# Patient Record
Sex: Female | Born: 1995 | Hispanic: Yes | Marital: Single | Smoking: Never smoker
Health system: Southern US, Community
[De-identification: ages and names within clinical notes are randomized; demographics above are authoritative.]

## PROBLEM LIST (undated history)

## (undated) ENCOUNTER — Inpatient Hospital Stay (HOSPITAL_COMMUNITY): Payer: Self-pay

## (undated) DIAGNOSIS — O34599 Maternal care for other abnormalities of gravid uterus, unspecified trimester: Secondary | ICD-10-CM

## (undated) DIAGNOSIS — Q5128 Other doubling of uterus, other specified: Secondary | ICD-10-CM

## (undated) DIAGNOSIS — B951 Streptococcus, group B, as the cause of diseases classified elsewhere: Secondary | ICD-10-CM

## (undated) DIAGNOSIS — Q600 Renal agenesis, unilateral: Secondary | ICD-10-CM

## (undated) HISTORY — DX: Renal agenesis, unilateral: Q60.0

## (undated) HISTORY — DX: Streptococcus, group b, as the cause of diseases classified elsewhere: B95.1

## (undated) HISTORY — PX: NO PAST SURGERIES: SHX2092

---

## 2013-07-27 NOTE — L&D Delivery Note (Addendum)
Delivery Note  5228w3d IOL 2/2 anhydramnios suspected to be due to preterm premature rupture of membranes although patient denied loss of fluid with chorio. At 4:16 PM a viable female was delivered via Vaginal, Spontaneous Delivery (Presentation: LOA ).  APGAR: 8, 9; weight 4 lb 9.7 oz (2090 g).   Placenta status: Intact, Spontaneous Pathology.  Cord: 3 vessels with the following complications: manual extraction secondary to vilamentous cord insertion with suspected cord avulsion although cord remained connected to membranes, cervical laceration.  Unclear if patient had vaginal septum vs cervical septum, last exam by me patient was dilated to 2/100/0, suspect patient pushed through cervix vs rapid cervical change.  Anesthesia: Epidural  Episiotomy: None Lacerations: Cervical Suture Repair: no repair at this time, vagina was packed.  Packing left in vagina Est. Blood Loss (mL): 400  Mom to postpartum.  Baby to Couplet care / Skin to Skin.  Carol [name] ROCIO 05/22/2014, 8:31 PM

## 2013-11-27 LAB — OB RESULTS CONSOLE HGB/HCT, BLOOD
HEMATOCRIT: 35 %
Hemoglobin: 11.7 g/dL

## 2013-11-27 LAB — OB RESULTS CONSOLE ABO/RH: RH Type: POSITIVE

## 2013-11-27 LAB — OB RESULTS CONSOLE RUBELLA ANTIBODY, IGM: RUBELLA: IMMUNE

## 2013-11-27 LAB — OB RESULTS CONSOLE HEPATITIS B SURFACE ANTIGEN: Hepatitis B Surface Ag: NEGATIVE

## 2013-11-27 LAB — SICKLE CELL SCREEN: Sickle Cell Screen: NORMAL

## 2013-11-27 LAB — OB RESULTS CONSOLE HIV ANTIBODY (ROUTINE TESTING): HIV: NONREACTIVE

## 2013-11-27 LAB — OB RESULTS CONSOLE RPR: RPR: NONREACTIVE

## 2013-11-27 LAB — CULTURE, OB URINE: URINE CULTURE, OB: NEGATIVE

## 2013-11-27 LAB — OB RESULTS CONSOLE GC/CHLAMYDIA
CHLAMYDIA, DNA PROBE: NEGATIVE
GC PROBE AMP, GENITAL: NEGATIVE

## 2013-11-27 LAB — OB RESULTS CONSOLE VARICELLA ZOSTER ANTIBODY, IGG: VARICELLA IGG: IMMUNE

## 2013-11-27 LAB — GLUCOSE TOLERANCE, 1 HOUR (50G) W/O FASTING: GLUCOSE 1 HOUR GTT: 108 mg/dL (ref ?–200)

## 2013-11-27 LAB — OB RESULTS CONSOLE ANTIBODY SCREEN: Antibody Screen: NEGATIVE

## 2014-02-28 DIAGNOSIS — Z789 Other specified health status: Secondary | ICD-10-CM

## 2014-02-28 DIAGNOSIS — Q5128 Other doubling of uterus, other specified: Principal | ICD-10-CM

## 2014-02-28 DIAGNOSIS — O34592 Maternal care for other abnormalities of gravid uterus, second trimester: Secondary | ICD-10-CM

## 2014-02-28 DIAGNOSIS — Z758 Other problems related to medical facilities and other health care: Secondary | ICD-10-CM

## 2014-03-02 ENCOUNTER — Encounter: Payer: Self-pay | Admitting: *Deleted

## 2014-03-02 DIAGNOSIS — Q5128 Other doubling of uterus, other specified: Secondary | ICD-10-CM | POA: Insufficient documentation

## 2014-03-02 DIAGNOSIS — Z789 Other specified health status: Secondary | ICD-10-CM | POA: Insufficient documentation

## 2014-03-02 DIAGNOSIS — Q512 Other doubling of uterus, unspecified: Secondary | ICD-10-CM

## 2014-03-22 ENCOUNTER — Encounter: Payer: Self-pay | Admitting: Family

## 2014-03-22 ENCOUNTER — Ambulatory Visit (INDEPENDENT_AMBULATORY_CARE_PROVIDER_SITE_OTHER): Payer: Medicaid Other | Admitting: Obstetrics & Gynecology

## 2014-03-22 VITALS — BP 118/68 | HR 85 | Temp 99.1°F | Ht 62.5 in | Wt 148.4 lb

## 2014-03-22 DIAGNOSIS — O0992 Supervision of high risk pregnancy, unspecified, second trimester: Secondary | ICD-10-CM

## 2014-03-22 DIAGNOSIS — O0990 Supervision of high risk pregnancy, unspecified, unspecified trimester: Secondary | ICD-10-CM | POA: Insufficient documentation

## 2014-03-22 DIAGNOSIS — O3400 Maternal care for unspecified congenital malformation of uterus, unspecified trimester: Secondary | ICD-10-CM

## 2014-03-22 LAB — POCT URINALYSIS DIP (DEVICE)
Bilirubin Urine: NEGATIVE
Glucose, UA: NEGATIVE mg/dL
Hgb urine dipstick: NEGATIVE
Ketones, ur: NEGATIVE mg/dL
LEUKOCYTES UA: NEGATIVE
Nitrite: NEGATIVE
PH: 7.5 (ref 5.0–8.0)
PROTEIN: NEGATIVE mg/dL
Specific Gravity, Urine: 1.015 (ref 1.005–1.030)
Urobilinogen, UA: 0.2 mg/dL (ref 0.0–1.0)

## 2014-03-22 NOTE — Progress Notes (Signed)
New OB referred by [facility] with uterine didelphis by [place] 7/24 at [facility]  Subjective:referred due to uterine didelphis    Carol [name] is a G1P0 [redacted]w[redacted]d being seen today for her first obstetrical visit.  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 Patient does intend to breast feed. Pregnancy history fully reviewed.  Patient reports no complaints.  Filed Vitals:   03/22/14 0927 03/22/14 0934  BP: 118/68   Pulse: 85   Temp: 99.1 F (37.3 C)   Height:  5' 2.5" (1.588 m)  Weight: 148 lb 6.4 oz (67.314 kg)     HISTORY: OB History  Gravida Para Term Preterm AB SAB TAB Ectopic Multiple Living  1             # Outcome Date GA Lbr Len/2nd Weight Sex Delivery Anes PTL Lv  1 CUR              History reviewed. No pertinent past medical history. History reviewed. No pertinent past surgical history. Family History  Problem Relation Age of Onset  . Diabetes Mother   . Diabetes Father      Exam    Uterus:  Fundal Height: 25 cm  Pelvic Exam:    Perineum:    Vulva:    Vagina:     pH:    Cervix:    Adnexa:    Bony Pelvis: average  System: Breast:      Skin: normal coloration and turgor, no rashes    Neurologic: oriented, normal mood   Extremities: normal strength, tone, and muscle mass   HEENT PERRLA   Mouth/Teeth dental hygiene good   Neck supple   Cardiovascular:     Respiratory:  appears well, vitals normal, no respiratory distress, acyanotic, normal RR   Abdomen: soft, non-tender; bowel sounds normal; no masses,  no organomegaly   Urinary: urethral meatus normal      Assessment:    Pregnancy: G1P0 Patient Active Problem List   Diagnosis Date Noted  . Supervision of high risk pregnancy, antepartum 03/22/2014  . 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 Language barrier 03/02/2014        Plan:     Initial labs drawn. Prenatal vitamins. Problem list reviewed and updated. Genetic Screening discussed was not done Follow up  in 2 weeks. 50% of 30 min visit spent on counseling and coordination of care.  [place] repeat   ARNOLD,JAMES 03/23/2014

## 2014-03-22 NOTE — Progress Notes (Signed)
C/o abdomen feeling tight sometimes, usually after dinner or walking a lot. States spotted brownish discharge at 3 months pregnant, none since. Given new patient information. Discussed BMI/ Appropriate weight gain.

## 2014-03-22 NOTE — Patient Instructions (Signed)

## 2014-03-22 NOTE — Progress Notes (Signed)
Ultrasound with MFM 03/30/14 @ 1030a, appointment information given to pt through interpreter Dori.

## 2014-03-23 LAB — WET PREP, GENITAL
Clue Cells Wet Prep HPF POC: NONE SEEN
Trich, Wet Prep: NONE SEEN
WBC, Wet Prep HPF POC: NONE SEEN
YEAST WET PREP: NONE SEEN

## 2014-03-30 ENCOUNTER — Encounter (HOSPITAL_COMMUNITY): Payer: Self-pay

## 2014-03-30 ENCOUNTER — Ambulatory Visit (HOSPITAL_COMMUNITY)
Admission: RE | Admit: 2014-03-30 | Discharge: 2014-03-30 | Disposition: A | Discharge status: Home / Self Care | Payer: Medicaid Other | Source: Ambulatory Visit | Attending: Obstetrics & Gynecology | Admitting: Obstetrics & Gynecology

## 2014-03-30 ENCOUNTER — Other Ambulatory Visit: Payer: Self-pay | Admitting: Obstetrics & Gynecology

## 2014-03-30 ENCOUNTER — Other Ambulatory Visit (HOSPITAL_COMMUNITY): Payer: Self-pay | Admitting: Maternal and Fetal Medicine

## 2014-03-30 VITALS — BP 116/66 | HR 92 | Wt 153.0 lb

## 2014-03-30 DIAGNOSIS — O0992 Supervision of high risk pregnancy, unspecified, second trimester: Secondary | ICD-10-CM

## 2014-03-30 DIAGNOSIS — Q5128 Other doubling of uterus, other specified: Secondary | ICD-10-CM

## 2014-03-30 DIAGNOSIS — O3400 Maternal care for unspecified congenital malformation of uterus, unspecified trimester: Secondary | ICD-10-CM | POA: Diagnosis not present

## 2014-03-30 DIAGNOSIS — O34599 Maternal care for other abnormalities of gravid uterus, unspecified trimester: Principal | ICD-10-CM

## 2014-03-30 DIAGNOSIS — O09899 Supervision of other high risk pregnancies, unspecified trimester: Secondary | ICD-10-CM | POA: Insufficient documentation

## 2014-03-30 DIAGNOSIS — O34592 Maternal care for other abnormalities of gravid uterus, second trimester: Secondary | ICD-10-CM

## 2014-04-05 ENCOUNTER — Ambulatory Visit (INDEPENDENT_AMBULATORY_CARE_PROVIDER_SITE_OTHER): Payer: Medicaid Other | Admitting: Family

## 2014-04-05 VITALS — BP 113/67 | HR 89 | Temp 99.1°F | Wt 150.3 lb

## 2014-04-05 DIAGNOSIS — O0992 Supervision of high risk pregnancy, unspecified, second trimester: Secondary | ICD-10-CM

## 2014-04-05 DIAGNOSIS — Z23 Encounter for immunization: Secondary | ICD-10-CM

## 2014-04-05 DIAGNOSIS — O3400 Maternal care for unspecified congenital malformation of uterus, unspecified trimester: Secondary | ICD-10-CM

## 2014-04-05 DIAGNOSIS — O0990 Supervision of high risk pregnancy, unspecified, unspecified trimester: Secondary | ICD-10-CM

## 2014-04-05 LAB — CBC
HCT: 33.8 % — ABNORMAL LOW (ref 36.0–46.0)
Hemoglobin: 11.6 g/dL — ABNORMAL LOW (ref 12.0–15.0)
MCH: 30.9 pg (ref 26.0–34.0)
MCHC: 34.3 g/dL (ref 30.0–36.0)
MCV: 90.1 fL (ref 78.0–100.0)
Platelets: 248 10*3/uL (ref 150–400)
RBC: 3.75 MIL/uL — ABNORMAL LOW (ref 3.87–5.11)
RDW: 12.9 % (ref 11.5–15.5)
WBC: 8 10*3/uL (ref 4.0–10.5)

## 2014-04-05 LAB — POCT URINALYSIS DIP (DEVICE)
Bilirubin Urine: NEGATIVE
Glucose, UA: NEGATIVE mg/dL
Hgb urine dipstick: NEGATIVE
KETONES UR: NEGATIVE mg/dL
LEUKOCYTES UA: NEGATIVE
Nitrite: NEGATIVE
Protein, ur: NEGATIVE mg/dL
Specific Gravity, Urine: 1.015 (ref 1.005–1.030)
Urobilinogen, UA: 0.2 mg/dL (ref 0.0–1.0)
pH: 8.5 — ABNORMAL HIGH (ref 5.0–8.0)

## 2014-04-05 MED ORDER — TETANUS-DIPHTH-ACELL PERTUSSIS 5-2.5-18.5 LF-MCG/0.5 IM SUSP: 0.5000 mL | Freq: Once | INTRAMUSCULAR | Status: DC

## 2014-04-05 NOTE — Progress Notes (Signed)
No questions or concerns.  Third trimester labs today.  Plans to do flu and Tdap in two weeks. Next growth [place] on 05/11/14.

## 2014-04-05 NOTE — Progress Notes (Signed)
C/o tightness when walks a lot- discussed this could be contractions and advised make sure drinking plenty of water/ fluids and preterm labor precautions.

## 2014-04-06 ENCOUNTER — Encounter: Payer: Self-pay | Admitting: Family

## 2014-04-06 LAB — GLUCOSE TOLERANCE, 1 HOUR (50G) W/O FASTING: Glucose, 1 Hour GTT: 89 mg/dL (ref 70–140)

## 2014-04-06 LAB — RPR

## 2014-04-06 LAB — HIV ANTIBODY (ROUTINE TESTING W REFLEX): HIV: NONREACTIVE

## 2014-04-19 ENCOUNTER — Ambulatory Visit (INDEPENDENT_AMBULATORY_CARE_PROVIDER_SITE_OTHER): Payer: Medicaid Other | Admitting: Family

## 2014-04-19 VITALS — BP 121/63 | HR 102 | Temp 98.7°F | Wt 154.2 lb

## 2014-04-19 DIAGNOSIS — O0990 Supervision of high risk pregnancy, unspecified, unspecified trimester: Secondary | ICD-10-CM

## 2014-04-19 DIAGNOSIS — Z23 Encounter for immunization: Secondary | ICD-10-CM

## 2014-04-19 DIAGNOSIS — O0992 Supervision of high risk pregnancy, unspecified, second trimester: Secondary | ICD-10-CM

## 2014-04-19 DIAGNOSIS — N898 Other specified noninflammatory disorders of vagina: Secondary | ICD-10-CM

## 2014-04-19 LAB — POCT URINALYSIS DIP (DEVICE)
Bilirubin Urine: NEGATIVE
Glucose, UA: NEGATIVE mg/dL
Hgb urine dipstick: NEGATIVE
Ketones, ur: NEGATIVE mg/dL
Leukocytes, UA: NEGATIVE
Nitrite: NEGATIVE
Protein, ur: NEGATIVE mg/dL
Specific Gravity, Urine: 1.01 (ref 1.005–1.030)
Urobilinogen, UA: 0.2 mg/dL (ref 0.0–1.0)
pH: 7.5 (ref 5.0–8.0)

## 2014-04-19 NOTE — Progress Notes (Signed)
Reports intermittent pelvic pain, especially when lying down and occasional abdominal tightening. C/o of right sided mid-lower back pain.  Flu and tdap today.

## 2014-04-19 NOTE — Progress Notes (Signed)
Reports intermittent pelvic pain, especially when lying down and occasional abdominal tightening (before bedtime). Reports thin white discharge, pelvic: neg pooling, neg fern; appears like BV on exam.  Wet prep sent.   C/o of right sided mid-lower back pain.  Denies UTI symptoms.  Flu and Tdap today.  Growth [place] scheduled on 05/11/14.

## 2014-04-19 NOTE — Addendum Note (Signed)
Addended by: Louanna Raw on: 04/19/2014 12:42 PM   Modules accepted: Orders

## 2014-04-20 LAB — WET PREP, GENITAL
Trich, Wet Prep: NONE SEEN
YEAST WET PREP: NONE SEEN

## 2014-04-23 ENCOUNTER — Other Ambulatory Visit: Payer: Self-pay | Admitting: Family

## 2014-04-23 MED ORDER — METRONIDAZOLE 500 MG PO TABS: 500.0000 mg | ORAL_TABLET | Freq: Two times a day (BID) | ORAL | Status: DC

## 2014-04-23 NOTE — Progress Notes (Signed)
Pt informed regarding +clue on wet prep.  RX for flagyl sent to pharmacy.  Used interpreter [phone].

## 2014-05-03 ENCOUNTER — Ambulatory Visit (INDEPENDENT_AMBULATORY_CARE_PROVIDER_SITE_OTHER): Payer: Medicaid Other | Admitting: Family

## 2014-05-03 VITALS — BP 136/66 | HR 107 | Temp 99.1°F | Wt 155.1 lb

## 2014-05-03 DIAGNOSIS — O0992 Supervision of high risk pregnancy, unspecified, second trimester: Secondary | ICD-10-CM

## 2014-05-03 LAB — POCT URINALYSIS DIP (DEVICE)
Bilirubin Urine: NEGATIVE
Glucose, UA: NEGATIVE mg/dL
Hgb urine dipstick: NEGATIVE
Ketones, ur: NEGATIVE mg/dL
NITRITE: NEGATIVE
PROTEIN: NEGATIVE mg/dL
SPECIFIC GRAVITY, URINE: 1.015 (ref 1.005–1.030)
UROBILINOGEN UA: 0.2 mg/dL (ref 0.0–1.0)
pH: 7.5 (ref 5.0–8.0)

## 2014-05-03 MED ORDER — CLINDAMYCIN PHOSPHATE 2 % VA CREA: 1.0000 | TOPICAL_CREAM | Freq: Every day | VAGINAL | Status: DC

## 2014-05-03 NOTE — Progress Notes (Signed)
Reports increased nausea with flagyl, desires different medication.  RX clindamycin cream hs Intermittent sharp jabbing pain in vaginal area.  No vaginal bleeding or leaking of fluid.  Growth ultrasound scheduled for 10/16.

## 2014-05-03 NOTE — Progress Notes (Signed)
Patient reports she stopped taking flagyl (RX for BV) because it made her nauseated-- requesting different RX.  Reports "puncturing sensations" in pelvic area for the last 3 weeks.

## 2014-05-04 ENCOUNTER — Other Ambulatory Visit: Payer: Self-pay | Admitting: Family Medicine

## 2014-05-04 MED ORDER — METRONIDAZOLE 1 % EX GEL: Freq: Every day | CUTANEOUS | Status: DC

## 2014-05-07 ENCOUNTER — Inpatient Hospital Stay (HOSPITAL_COMMUNITY): Payer: Medicaid Other

## 2014-05-07 ENCOUNTER — Inpatient Hospital Stay (HOSPITAL_COMMUNITY)
Admission: AD | Admit: 2014-05-07 | Discharge: 2014-05-24 | DRG: 767 | Disposition: A | Discharge status: Home / Self Care | Payer: Medicaid Other | Source: Ambulatory Visit | Attending: Family Medicine | Admitting: Family Medicine

## 2014-05-07 ENCOUNTER — Encounter (HOSPITAL_COMMUNITY): Payer: Self-pay | Admitting: *Deleted

## 2014-05-07 DIAGNOSIS — Z789 Other specified health status: Secondary | ICD-10-CM | POA: Diagnosis present

## 2014-05-07 DIAGNOSIS — O368130 Decreased fetal movements, third trimester, not applicable or unspecified: Secondary | ICD-10-CM | POA: Diagnosis present

## 2014-05-07 DIAGNOSIS — O43123 Velamentous insertion of umbilical cord, third trimester: Secondary | ICD-10-CM | POA: Diagnosis present

## 2014-05-07 DIAGNOSIS — O411230 Chorioamnionitis, third trimester, not applicable or unspecified: Secondary | ICD-10-CM | POA: Diagnosis present

## 2014-05-07 DIAGNOSIS — Z3A32 32 weeks gestation of pregnancy: Secondary | ICD-10-CM | POA: Diagnosis present

## 2014-05-07 DIAGNOSIS — Q5210 Doubling of vagina, unspecified: Secondary | ICD-10-CM

## 2014-05-07 DIAGNOSIS — O42913 Preterm premature rupture of membranes, unspecified as to length of time between rupture and onset of labor, third trimester: Secondary | ICD-10-CM | POA: Diagnosis present

## 2014-05-07 DIAGNOSIS — O34593 Maternal care for other abnormalities of gravid uterus, third trimester: Secondary | ICD-10-CM | POA: Diagnosis present

## 2014-05-07 DIAGNOSIS — Q512 Other doubling of uterus, unspecified: Secondary | ICD-10-CM

## 2014-05-07 DIAGNOSIS — O3463 Maternal care for abnormality of vagina, third trimester: Secondary | ICD-10-CM | POA: Diagnosis present

## 2014-05-07 DIAGNOSIS — Z3A34 34 weeks gestation of pregnancy: Secondary | ICD-10-CM | POA: Diagnosis not present

## 2014-05-07 DIAGNOSIS — O368190 Decreased fetal movements, unspecified trimester, not applicable or unspecified: Secondary | ICD-10-CM

## 2014-05-07 DIAGNOSIS — O4103X1 Oligohydramnios, third trimester, fetus 1: Secondary | ICD-10-CM

## 2014-05-07 DIAGNOSIS — O368390 Maternal care for abnormalities of the fetal heart rate or rhythm, unspecified trimester, not applicable or unspecified: Secondary | ICD-10-CM

## 2014-05-07 DIAGNOSIS — Q5128 Other doubling of uterus, other specified: Secondary | ICD-10-CM

## 2014-05-07 DIAGNOSIS — Z3A33 33 weeks gestation of pregnancy: Secondary | ICD-10-CM

## 2014-05-07 DIAGNOSIS — Z833 Family history of diabetes mellitus: Secondary | ICD-10-CM | POA: Diagnosis not present

## 2014-05-07 DIAGNOSIS — O4103X0 Oligohydramnios, third trimester, not applicable or unspecified: Secondary | ICD-10-CM | POA: Diagnosis present

## 2014-05-07 DIAGNOSIS — O42919 Preterm premature rupture of membranes, unspecified as to length of time between rupture and onset of labor, unspecified trimester: Secondary | ICD-10-CM

## 2014-05-07 HISTORY — DX: Maternal care for other abnormalities of gravid uterus, unspecified trimester: O34.599

## 2014-05-07 HISTORY — DX: Other and unspecified doubling of uterus: Q51.28

## 2014-05-07 LAB — CBC
HEMATOCRIT: 33.5 % — AB (ref 36.0–46.0)
Hemoglobin: 11.5 g/dL — ABNORMAL LOW (ref 12.0–15.0)
MCH: 31.5 pg (ref 26.0–34.0)
MCHC: 34.3 g/dL (ref 30.0–36.0)
MCV: 91.8 fL (ref 78.0–100.0)
Platelets: 217 10*3/uL (ref 150–400)
RBC: 3.65 MIL/uL — ABNORMAL LOW (ref 3.87–5.11)
RDW: 12.7 % (ref 11.5–15.5)
WBC: 10.3 10*3/uL (ref 4.0–10.5)

## 2014-05-07 LAB — TYPE AND SCREEN
ABO/RH(D): O POS
ANTIBODY SCREEN: NEGATIVE

## 2014-05-07 LAB — OB RESULTS CONSOLE GBS: GBS: NEGATIVE

## 2014-05-07 MED ORDER — ZOLPIDEM TARTRATE 5 MG PO TABS
5.0000 mg | ORAL_TABLET | Freq: Every evening | ORAL | Status: DC | PRN
  Filled 2014-05-07: qty 1

## 2014-05-07 MED ORDER — DOCUSATE SODIUM 100 MG PO CAPS
100.0000 mg | ORAL_CAPSULE | Freq: Every day | ORAL | Status: DC
  Administered 2014-05-08 – 2014-05-18 (×11): 100 mg via ORAL
  Filled 2014-05-07 (×11): qty 1

## 2014-05-07 MED ORDER — PRENATAL MULTIVITAMIN CH: 1.0000 | ORAL_TABLET | Freq: Every day | ORAL | Status: DC

## 2014-05-07 MED ORDER — LACTATED RINGERS IV SOLN
INTRAVENOUS | Status: DC
  Administered 2014-05-07 – 2014-05-18 (×18): via INTRAVENOUS

## 2014-05-07 MED ORDER — LACTATED RINGERS IV BOLUS (SEPSIS)
500.0000 mL | Freq: Once | INTRAVENOUS | Status: AC
  Administered 2014-05-07: 500 mL via INTRAVENOUS

## 2014-05-07 MED ORDER — CALCIUM CARBONATE ANTACID 500 MG PO CHEW: 2.0000 | CHEWABLE_TABLET | ORAL | Status: DC | PRN

## 2014-05-07 MED ORDER — ACETAMINOPHEN 325 MG PO TABS
650.0000 mg | ORAL_TABLET | ORAL | Status: DC | PRN
  Administered 2014-05-12 – 2014-05-16 (×2): 650 mg via ORAL
  Filled 2014-05-07 (×2): qty 2

## 2014-05-07 MED ORDER — BETAMETHASONE SOD PHOS & ACET 6 (3-3) MG/ML IJ SUSP
12.0000 mg | INTRAMUSCULAR | Status: AC
  Administered 2014-05-07 – 2014-05-08 (×2): 12 mg via INTRAMUSCULAR
  Filled 2014-05-07 (×2): qty 2

## 2014-05-07 NOTE — MAU Note (Addendum)
Patient presents to MAU with c/o decreased fetal movement and abdominal tightening. States has not felt baby move today and has had tightening of stomach on and off. Denies LOF or VB at this time. Reports a white discharge. States has prescription for Flagyl that she has not started yet.

## 2014-05-07 NOTE — H&P (Signed)
ANTEPARTUM ADMISSION HISTORY AND PHYSICAL  Angee Angeles Berneda RoseMoran is a 18 y.o. female G1P0 with IUP at 6191w2d by L/12 presenting for decreased fetal movement. Was in her usual state of health until this morning. Upon awakening she felt as though her uterus was tense. Denies pain or contractions. She has not felt her baby move all day and this is very concerning to her. Denies vaginal bleeding, loss of fluid, vaginal discharge or other symptoms.   Dating: By LMP --->  Estimated Date of Delivery: 06/30/14  Prenatal History/Complications: - Maternal uterine didelphis, pregnancy in right horn   Past Medical History: Past Medical History  Diagnosis Date  . Medical history non-contributory   . 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 No past surgeries      Obstetrical History: OB History   Grav Para Term Preterm Abortions TAB SAB Ect Mult Living   1              Gynecological History: Pap smears not yet indicated. No history of STI.   Social History: History   Social History  . Marital Status: Single    Spouse Name: N/A    Number of Children: N/A  . Years of Education: N/A   Social History Main Topics  . Smoking status: Never Smoker   . Smokeless tobacco: Never Used  . Alcohol Use: No  . Drug Use: No  . Sexual Activity: Yes    Birth Control/ Protection: None   Other Topics Concern  . None   Social History Narrative  . None    Family History: Family History  Problem Relation Age of Onset  . Diabetes Mother   . Diabetes Father     Allergies: No Known Allergies  Facility-administered medications prior to admission  Medication Dose Route Frequency Provider Last Rate Last Dose  . Tdap (BOOSTRIX) injection 0.5 mL  0.5 mL Intramuscular Once Walidah N Karim, CNM       Prescriptions prior to admission  Medication Sig Dispense Refill  . metroNIDAZOLE (FLAGYL) 500 MG  tablet Take 1 tablet (500 mg total) by mouth 2 (two) times daily.  14 tablet  0  . Prenatal Vit-Fe Fumarate-FA (PRENATAL MULTIVITAMIN) TABS tablet Take 1 tablet by mouth daily.         Review of Systems   All systems reviewed and negative except as stated in HPI  Blood pressure 133/74, pulse 86, temperature 98.5 F (36.9 C), temperature source Oral, resp. rate 20, height 5\' 4"  (1.626 m), weight 155 lb (70.308 kg), last menstrual period 09/07/2013, SpO2 100.00%. General appearance: alert, cooperative and no distress Lungs: normal effort Heart: normal rate Abdomen: soft, uterus gravid, firm but nontender to palpation Pelvic: deferred Extremities: Homans sign is negative, no sign of DVT Fetal monitoring Recurrent 1-2 minute decelerations to the 70s-80s without uterine contractions Uterine activity None     Prenatal labs: ABO, Rh: --/--/O POS (10/12 2114) Antibody: NEG (10/12 2114) Rubella:   RPR: NON REAC (09/10 1408)  HBsAg: Negative (05/04 0000)  HIV: NONREACTIVE (09/10 1408)  GBS:   pending 1 hr Glucola 89 Genetic screening: Too late Anatomy US Maternal uterine didelphis, otherwise fetal anatomy wnl   Prenatal Transfer Tool  Maternal Diabetes: No Genetic Screening: Declined; too late Maternal Ultrasounds/Referrals: Abnormal:  Findings:   Other: Maternal uterine didelphis; fetal anatomy wnl Fetal Ultrasounds or other Referrals:  None Maternal  Substance Abuse:  No Significant Maternal Medications:  None Significant Maternal Lab Results: Lab values include: Other: GBS pending     Results for orders placed during the hospital encounter of 05/07/14 (from the past 24 hour(s))  CBC   Collection Time    05/07/14  9:14 PM      Result Value Ref Range   WBC 10.3  4.0 - 10.5 K/uL   RBC 3.65 (*) 3.87 - 5.11 MIL/uL   Hemoglobin 11.5 (*) 12.0 - 15.0 g/dL   HCT 16.133.5 (*) 09.636.0 - 04.546.0 %   MCV 91.8  78.0 - 100.0 fL   MCH 31.5  26.0 - 34.0 pg   MCHC 34.3  30.0 - 36.0 g/dL   RDW  40.912.7  81.111.5 - 91.415.5 %   Platelets 217  150 - 400 K/uL  TYPE AND SCREEN   Collection Time    05/07/14  9:14 PM      Result Value Ref Range   ABO/RH(D) O POS     Antibody Screen NEG     Sample Expiration 05/10/2014      Patient Active Problem List   Diagnosis Date Noted  . Decreased fetal movement 05/07/2014  . Supervision of high risk pregnancy, antepartum 03/22/2014  . 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 Language barrier 03/02/2014    Assessment: Kassidy Angeles Berneda RoseMoran is a 18 y.o. G1P0 at 4642w2d here for prolonged monitoring in the setting of decreased fetal movement and recurrent decelerations on fetal monitoring.   # FWB. Decreased fetal movement since this morning with recurrent decelerations on fetal monitoring. Bedside BPP 8/8 with normal fluid levels which is reassuring, but continues to have nonreactive, nonreassuring strip.  - Continuous monitoring.  - Status post LR bolus. Continue LR at 125 cc/hr.  - Betamethasone once now. Repeat in 24 hours.   # Labor. Not contracting on the monitor and no signs of early labor at this time, but continue to monitor closely.   # ID: GBS pending.   William DaltonMcEachern, Alayjah Boehringer 05/07/2014, 10:47 PM

## 2014-05-07 NOTE — Progress Notes (Signed)
At 1932, Patient had 3 minute prolonged deceleration on EFM; heart rate down to 73 then recovered to 155 baseline with position change to right lateral and IV start. Dr. Ane PaymentMcEachern in department and made aware of EFM tracing. BPP ordered.

## 2014-05-07 NOTE — Progress Notes (Signed)
Sonographer at bedside for bedside ultrasound (BPP)

## 2014-05-07 NOTE — Progress Notes (Signed)
Dr. Ane PaymentMcEachern called to review tracing by RN. FHR down to 95 with recovery to baseline 160 after maternal position change to left lateral. No new orders received at this time. Will continue to monitor and assess. MD also informed of BPP 8/8.

## 2014-05-08 ENCOUNTER — Other Ambulatory Visit (HOSPITAL_COMMUNITY): Payer: Medicaid Other

## 2014-05-08 ENCOUNTER — Inpatient Hospital Stay (HOSPITAL_COMMUNITY): Payer: Medicaid Other

## 2014-05-08 LAB — RPR

## 2014-05-08 LAB — ABO/RH: ABO/RH(D): O POS

## 2014-05-08 LAB — HIV ANTIBODY (ROUTINE TESTING W REFLEX): HIV 1&2 Ab, 4th Generation: NONREACTIVE

## 2014-05-08 LAB — FETAL FIBRONECTIN: Fetal Fibronectin: NEGATIVE

## 2014-05-08 MED ORDER — PRENATAL MULTIVITAMIN CH
1.0000 | ORAL_TABLET | Freq: Every day | ORAL | Status: DC
  Administered 2014-05-08 – 2014-05-18 (×11): 1 via ORAL
  Filled 2014-05-08 (×11): qty 1

## 2014-05-08 MED ORDER — NIFEDIPINE 10 MG PO CAPS
10.0000 mg | ORAL_CAPSULE | Freq: Once | ORAL | Status: AC
  Administered 2014-05-08: 10 mg via ORAL
  Filled 2014-05-08: qty 1

## 2014-05-08 NOTE — Progress Notes (Signed)
Ordered patient's lunch. Carol [name]  Interpreter.

## 2014-05-08 NOTE — Progress Notes (Signed)
I assisted Lillia AbedLindsay, RN with questions and explanation of care plan. [name]  Interpreter.

## 2014-05-08 NOTE — Progress Notes (Signed)
LABOR PROGRESS NOTE  Carol [name] is a 18 y.o. G1P0 at 4915w3d admitted for decreased fetal movement and recurrent decelerations, now contracting on the monitor.   Subjective: Uncomfortable with contractions. Improved fetal movement. Denies loss of fluid, vaginal bleeding, other concerning symptoms.  Objective: BP 133/74  Pulse 86  Temp(Src) 98.5 F (36.9 C) (Oral)  Resp 20  Ht 5\' 4"  (1.626 m)  Wt 155 lb (70.308 kg)  BMI 26.59 kg/m2  SpO2 100%  LMP 09/07/2013 or  Filed Vitals:   05/07/14 1852 05/07/14 2225  BP: 129/85 133/74  Pulse: 101 86  Temp: 98.2 F (36.8 C) 98.5 F (36.9 C)  TempSrc: Oral Oral  Resp: 16 20  Height:  5\' 4"  (1.626 m)  Weight:  155 lb (70.308 kg)  SpO2: 100%       FHT:  FHR: 150 bpm, variability: moderate,  accelerations:  Present,  decelerations:  Absent UC:   regular, every 2-3 minutes SVE:   Dilation: Fingertip Effacement (%): Thick Station: -3  Dilation: Fingertip Effacement (%): Thick Station: -3  Labs: Lab Results  Component Value Date   WBC 10.3 05/07/2014   HGB 11.5* 05/07/2014   HCT 33.5* 05/07/2014   MCV 91.8 05/07/2014   PLT 217 05/07/2014    Assessment / Plan: Preterm contractions.  #Preterm Contractions: Initially no evidence of contractions, but now contracting regularly and uncomfortable. SVE FT/Th/High. Will give procardia 10 mg once and monitor.  #FWB: Initially decreased fetal movement with recurrent decelerations. 8/8 BPP with normal fluid levels. Now reactive strip.   William DaltonMcEachern, Mildred Tuccillo, MD 05/08/2014, 1:14 AM

## 2014-05-08 NOTE — Progress Notes (Signed)
ANTEPARTUM PROGRESS NOTE  Teala Angeles Berneda RoseMoran is a 18 y.o. G1P0 at 6349w3d admitted for decreased fetal movement and recurrent decelerations, now contracting on the monitor.   Subjective: Continues to feel contractions although less intense, able to sleep through them now. Improved fetal movement overnight. Denies loss of fluid, vaginal bleeding, other concerning symptoms.  Objective: BP 105/76  Pulse 94  Temp(Src) 98.6 F (37 C) (Oral)  Resp 18  Ht 5\' 4"  (1.626 m)  Wt 155 lb (70.308 kg)  BMI 26.59 kg/m2  SpO2 100%  LMP 09/07/2013 or  Filed Vitals:   05/07/14 1852 05/07/14 2225 05/08/14 0125 05/08/14 0629  BP: 129/85 133/74 132/74 105/76  Pulse: 101 86 81 94  Temp: 98.2 F (36.8 C) 98.5 F (36.9 C)  98.6 F (37 C)  TempSrc: Oral Oral  Oral  Resp: 16 20  18   Height:  5\' 4"  (1.626 m)    Weight:  155 lb (70.308 kg)    SpO2: 100%         FHT:  FHR: 150 bpm, variability: moderate,  accelerations:  Present,  decelerations:  Absent UC:   regular, every 2-3 minutes SVE:   Dilation: Fingertip Effacement (%): Thick Station: -3  Dilation: Fingertip Effacement (%): Thick Station: -3  Labs: Lab Results  Component Value Date   WBC 10.3 05/07/2014   HGB 11.5* 05/07/2014   HCT 33.5* 05/07/2014   MCV 91.8 05/07/2014   PLT 217 05/07/2014    Assessment / Plan: Preterm contractions.  #Preterm Contractions: Initial SVE FT/T/H. Contractions spaced some with procardia, but returned shortly after administering the medication. SVE unchanged this morning.  - Continue IVF.  #FWB: Initially decreased fetal movement with recurrent decelerations. 8/8 BPP with normal fluid levels. Now reactive strip. Betamethasone once 10/12 at 2230, repeat tonight. Growth scan today.   William DaltonMcEachern, Montoya Brandel, MD 05/08/2014, 6:31 AM

## 2014-05-08 NOTE — Progress Notes (Signed)
I assisted Belenda CruiseKristin, RN,with basic questions about pain level. Also I assisted Dr Ane PaymentMcEachern with explanation about patient care . By Orlan LeavensViria Alvarez, Spanish Interpreter

## 2014-05-08 NOTE — Progress Notes (Signed)
I assisted Dr William DaltonMorgan McEachern with a explanation about treatment plan. By Orlan LeavensViria Alvarez, Interpreter

## 2014-05-09 ENCOUNTER — Inpatient Hospital Stay (HOSPITAL_COMMUNITY): Payer: Medicaid Other

## 2014-05-09 LAB — CULTURE, BETA STREP (GROUP B ONLY)

## 2014-05-09 LAB — AMNISURE RUPTURE OF MEMBRANE (ROM) NOT AT ARMC: Amnisure ROM: NEGATIVE

## 2014-05-09 MED ORDER — INFLUENZA VAC SPLIT QUAD 0.5 ML IM SUSY
0.5000 mL | PREFILLED_SYRINGE | INTRAMUSCULAR | Status: AC
  Administered 2014-05-18: 0.5 mL via INTRAMUSCULAR
  Filled 2014-05-09: qty 0.5

## 2014-05-09 NOTE — Progress Notes (Signed)
Patient ID: [name], female   DOB: 10-16-1995, 18 y.o.   MRN: [id number] Roanna Angeles Berneda RoseMoran is a 18 y.o. G1P0 at 472w4d admitted for UCs and decreased FM, didelphys uterus Subjective: Comfortable.  Objective: BP 118/69  Pulse 99  Temp(Src) 98.8 F (37.1 C) (Oral)  Resp 18  Ht 5\' 4"  (1.626 m)  Wt 70.308 kg (155 lb)  BMI 26.59 kg/m2  SpO2 100%  LMP 09/07/2013  Fetal Heart FHR: 135-140 bpm, variability: moderate,  accelerations:  Present,  decelerations:  Absent  Had isolated single severe FHR deceleration during the night Contractions: none  SVE:   Dilation: Fingertip Effacement (%): Thick Station:  (high station) Exam by:: Dr Ane PaymentMcEachern SVE at 11:40: soft ext ftp, closed/2.5cm long/high, ?breech SSE:  Neg pool  US prelim: oligohydramnios BPP 6/8 (-2 AF)  Results for orders placed during the hospital encounter of 05/07/14 (from the past 24 hour(s))  AMNISURE RUPTURE OF MEMBRANE (ROM)     Status: None   Collection Time    05/09/14 11:25 AM      Result Value Ref Range   Amnisure ROM NEGATIVE     Assessment / Plan: Oligohydramnios Labor: n/a Fetal Wellbeing: oligohydramnios> EFM assessing Pain Control:  n/a Expected mode of delivery: NSVD  POE,DEIRDRE 05/09/2014, 12:01 PM

## 2014-05-09 NOTE — Discharge Summary (Signed)
Attestation of Attending Supervision of Advanced Practitioner (CNM/NP): Evaluation and management procedures were performed by the Advanced Practitioner under my supervision and collaboration.  I have reviewed the Advanced Practitioner's note and chart, and I agree with the management and plan.  Eveny Anastas 05/09/2014 8:46 AM   

## 2014-05-09 NOTE — Progress Notes (Signed)
I stopped by patients room to check on her needs, By Viria Alvarez, Interpreter °

## 2014-05-09 NOTE — Progress Notes (Signed)
I was present at the time Dr Emelda FearFerguson was explain  Patients plan of care, Cassie RN was there too. By Orlan LeavensViria Alvarez, Interpreter

## 2014-05-09 NOTE — Progress Notes (Signed)
Stopped by to check on patient's needs. [name] Interpreter. °

## 2014-05-09 NOTE — Progress Notes (Signed)
I assisted Carol [name] with explanation of care plan.[name]  Interpreter.

## 2014-05-09 NOTE — Discharge Summary (Signed)
Physician Discharge Summary  Patient ID: Carol [name] MRN: [id number] DOB/AGE: 07-30-1995 18 y.o.  Admit date: 05/07/2014 Discharge date: 05/09/2014  Admission Diagnoses:  Discharge Diagnoses:  Active Problems:   Decreased fetal movement   Discharged Condition: good  Hospital Course: admitted with secondary to preterm labor, contractions, variable decel to 30s and to 60s lasting 1-2 minutes, had total of 4 decels.  Reports feeling few contractions, however rarely.  Slept comfortably overnight.  No vaginal bleeding, no loss of fluid, normal fetal movement.  Significant history of didelphys uterus.  Patient had 1 variable decel to 60s lasting 2 minutes this morning. ==> repeat BPP prior to discharge, discharge if 8/8  Dilation: Fingertip Effacement (%): Thick Station:  (high station) Exam by:: Dr Ane PaymentMcEachern   Consults: None  Significant Diagnostic Studies:   Labs: No results found for this or any previous visit (from the past 24 hour(s)).  Imaging Studies:  [place] Ob Follow Up  05/08/2014   OBSTETRICAL ULTRASOUND: Repeat fetal growth 2-3 weeks Slight decrease in AFI to 8.5: repeat in 1 week  [place] Fetal Bpp W/o Non Stress  05/08/2014   8/8, AFI 11    Treatments: betamethasone, prolonged monitoring  Discharge Exam: Blood pressure 118/46, pulse 100, temperature 98.3 F (36.8 C), temperature source Oral, resp. rate 18, height 5\' 4"  (1.626 m), weight 155 lb (70.308 kg), last menstrual period 09/07/2013, SpO2 100.00%. General appearance: alert, cooperative and no distress Resp: clear to auscultation bilaterally Breasts: normal appearance, no masses or tenderness Cardio: regular rate and rhythm, S1, S2 normal, no murmur, click, rub or gallop GI: soft, non-tender; bowel sounds normal; no masses,  no organomegaly Extremities: extremities normal, atraumatic, no cyanosis or edema Pulses: 2+ and symmetric Skin: Skin color, texture, turgor normal. No rashes or  lesions SVE by me: FT/thick/-1  Disposition: Final discharge disposition not confirmed  Prolonged monitoring on 10/17 in MAU NST with repeat AFI in clinic on 10/21 Growth sono in 2-3 weeks    Medication List         metroNIDAZOLE 500 MG tablet  Commonly known as:  FLAGYL  Take 1 tablet (500 mg total) by mouth 2 (two) times daily.     prenatal multivitamin Tabs tablet  Take 1 tablet by mouth daily.           Follow-up Information   Follow up with [facility] On 05/16/2014. (for NST and AFI)       [facility] On 05/12/2014. (for prolonged monitoring, NST)    Contact information:   [address] [id number] [place] [id number] [phone]      Signed: Perry MountCOSTA,Tywan Siever ROCIO 05/09/2014, 7:25 AM

## 2014-05-10 ENCOUNTER — Inpatient Hospital Stay (HOSPITAL_COMMUNITY): Payer: Medicaid Other

## 2014-05-10 LAB — TYPE AND SCREEN
ABO/RH(D): O POS
Antibody Screen: NEGATIVE

## 2014-05-10 LAB — AMNISURE RUPTURE OF MEMBRANE (ROM) NOT AT ARMC: Amnisure ROM: NEGATIVE

## 2014-05-10 MED ORDER — AZITHROMYCIN 250 MG PO TABS
500.0000 mg | ORAL_TABLET | Freq: Every day | ORAL | Status: AC
  Administered 2014-05-12 – 2014-05-16 (×4): 500 mg via ORAL
  Filled 2014-05-10 (×5): qty 2

## 2014-05-10 MED ORDER — DEXTROSE 5 % IV SOLN
500.0000 mg | INTRAVENOUS | Status: AC
  Administered 2014-05-10 – 2014-05-11 (×2): 500 mg via INTRAVENOUS
  Filled 2014-05-10 (×2): qty 500

## 2014-05-10 MED ORDER — AMOXICILLIN 500 MG PO CAPS
500.0000 mg | ORAL_CAPSULE | Freq: Three times a day (TID) | ORAL | Status: AC
  Administered 2014-05-12 – 2014-05-17 (×15): 500 mg via ORAL
  Filled 2014-05-10 (×15): qty 1

## 2014-05-10 MED ORDER — SODIUM CHLORIDE 0.9 % IV SOLN
2.0000 g | Freq: Four times a day (QID) | INTRAVENOUS | Status: AC
  Administered 2014-05-10 – 2014-05-12 (×8): 2 g via INTRAVENOUS
  Filled 2014-05-10 (×8): qty 2000

## 2014-05-10 NOTE — Progress Notes (Signed)
Patient ID: Carol [name], female   DOB: November 02, 1995, 18 y.o.   MRN: [id number] Discussed case with MFM today--given no fluid and decrease since admission.  They suggest we treat as PPROM.  Will begin latent Abx.  S/p BMZ.

## 2014-05-10 NOTE — Consult Note (Addendum)
MFM Consultation, Staff Note:  Impressions:  SIUP at 7230w5d on attempted fetal survey for patient admitted with diagnosis of severe oligohydramnios (1.1cm AFI without a 2x2cm pocket---near anhydramnios) No dysmorphic features Fetal kidneys are normal in size and echogenicity,  Fetal kidneys demonstrate normal calyceal architecture and parenchyma Renal pelves are normal in AP diameter Normally filled bladder and stomach Normal UA Dopplers Normal fetal growth pattern from review of 05/08/14 biometry AFI was normal but decreased 05/08/14 and now is severely oligohydramnios as defined by AFI 1.1cm and no 2x2cm pocket. Etiology of near anhydramnios is unclear in setting of negative sterile speculum exam and negative amnisure No previa Amniotic fluid volume is decreased No evidence of intraamniotic infection  Discussion: Patient has been diagnosed and admitted for severe oligohydramnios without clear etiology.  Fetal heart rate decelerations have been noted in context of overall reassuring fetal heart tracing.  BPP 6/8 (-2 for lack of fluid).  I was asked to evaluate her fetus and provide recommendations regarding the management of this diagnosis.  I spent a great deal of time (>60 minutes) with the patient in review of chart, ultrasound evaluation and in face-to-face discussion by way of translator (Spanish).  Greater than >50% of this time was spent in direct counseling.  I cited the limitation of our tests for ruptured membranes when there is no amniotic fluid to sample at the time of speculum examination or amnisure.  Given the normal fetal growth pattern, normal UA Dopplers, and normally filled fetal stomach and bladder without evidence of urinary tract dilation and normal appearing kidneys, this is likely preterm premature rupture of membranes (pPROM).  I feel it should be empirically managed as such as well with inpatient surveillance.  Attempt to resample the amniotic fluid is recommended by  speculum exam first thing in AM before patient gets out of bed in attempt to obtain amniotic fluid that would pool in the patient's vagina overnight if indeed she is ruptured.  If indigocarmine were available, I would have offered an amniodye test but this is not available currently per hospital staff.  Assuming pPROM, I explained to her that in order for labor to occur either preterm or term, there has to be membrane activation, uterine contractility and cervical dilation, which of these manifest primarily varies from individual to individual and frequently it is a combination of synchronous and asynchronous activations that lead to eventual preterm delivery. Any number of precipitating events in combination may be the basis or pathologic activation of any of these pathways. Regardless, I explained to her that at this point our primary concern was for ascending intrauterine/intraamniotic infection, which poses risk to her as well as her fetus. She understood that inpatient management was essential with serial examinations and fetal heart rate tracings to screen for onset of infection and that evidence of such would prompt delivery.  I explained to her timing of delivery in absence of infection and in presence of reassuring fetal status has been debated historically and recently by many experts. Regardless, I cited to her that the best available and most well-accepted timing of delivery in context of otherwise reassuring maternal-fetus status affected by pPROM is at 5434 weeks gestational age. This most fully balances the risk of prematurity against that of an occult intrauterine infection. Occult intrauterine infection is the second leading cause of cerebral palsy as opposed to the leading cause which is, of course, prematurity. She seemed to grasp the concept as well as our specialty's rationale.  All of your  patient's questions were addressed to her satisfaction today. Although this was a lot of information  to take in, she demonstrated excellent comprehension of my impression, recommendations, and the underlying rationale.  Summary of Recommendations: 1. I agree with latency antibiotics course for 7 days; 2. Patient is receiving a course of betamethasone for reduction of fetal morbidity/mortality risk; 3. Timing of delivery should be anticipated by 34 weeks provided that testing remains reassuring and there is no evidence of chorioamnionitis. 4. I would deliver prior to 34 weeks for nonreassuring fetal testing, evidence of chorioamnionitis, or other evidence of maternal or fetal deterioration. 5. The patient should be monitored by serial clinical exams, serial vital signs, daily EFM's/NST's (more often if clinically warranted), CBC only as clinically indicated (noting that WBC at or above 20,000 constitutes leukocytosis for pregnancy). 6. NICU consultation is recommended. 7. Serial speculum exam in the AM in attempt to confirm my suspicion, noting that even if negative, I would still manage as pPROM/anhydramnios and deliver at 34-35 weeks.  Thank you for consultation. It was a pleasure having the opportunity to contribute to the care of your patient. Please page with questions. I spent in excess of 60 minutes in consultation with your patient with more than 50% of this time in direct face-to-face counseling and education.  Thank you, Louann SjogrenJeffrey Morgan Gaynelle Arabianenney  Mylin Hirano, Louann SjogrenJeffrey Morgan, MD, MS, FACOG Assistant Professor Section of Maternal-Fetal Medicine [facility]

## 2014-05-10 NOTE — Progress Notes (Signed)
Stopped by to check on patient's needs. [name] Interpreter. °

## 2014-05-10 NOTE — Progress Notes (Signed)
I assisted Heather, RN, with explanation of care plan. [name] Interpreter.

## 2014-05-10 NOTE — Progress Notes (Signed)
I was present with Dr Emelda FearFerguson during a test and examination. By Orlan LeavensViria Alvarez, Interpreter

## 2014-05-10 NOTE — Progress Notes (Signed)
Patient ID: Carol [name], female   DOB: 19-Oct-1995, 18 y.o.   MRN: [id number] FACULTY PRACTICE ANTEPARTUM(COMPREHENSIVE) NOTE  Carol [name] is a 18 y.o. G1P0 at 3781w5d by midtrimester ultrasound who is admitted for preterm labor, resolved, also noted to have had occasional variable decelerations.   Fetal presentation is cephalic. Length of Stay:  3  Days  Subjective: Pt was planned for discharge yesterday, and BPP done prior to discharge showed BPP 6/8 with reactive NST = BPP 8/10. Pt kept overnight for continued monitoring  And has had no further variables.  Patient reports the fetal movement as active. Patient reports uterine contraction  activity as none. Patient reports  vaginal bleeding as none. Patient describes fluid per vagina as None. We have checked Amnisure yesterday and again today, by me , with negative amnisure x2  Vitals:  Blood pressure 116/57, pulse 104, temperature 98.3 F (36.8 C), temperature source Oral, resp. rate 18, height 5\' 4"  (1.626 m), weight 155 lb (70.308 kg), last menstrual period 09/07/2013, SpO2 100.00%. Physical Examination:  General appearance - alert, well appearing, and in no distress Heart - normal rate and regular rhythm Abdomen - soft, nontender, nondistended Fundal Height:  size equals dates Cervical Exam: Speculum exam this am. Shows normal cervical length and fetal presentation is cephalic. Extremities: extremities normal, atraumatic, no cyanosis or edema and Homans sign is negative, no sign of DVT with DTRs 2+ bilaterally Membranes:intact  Fetal Monitoring:  Baseline: 145 bpm, Variability: Good {> 6 bpm), Accelerations: Reactive and Decelerations: Absent  Labs:  Results for orders placed during the hospital encounter of 05/07/14 (from the past 24 hour(s))  AMNISURE RUPTURE OF MEMBRANE (ROM)   Collection Time    05/09/14 11:25 AM      Result Value Ref Range   Amnisure ROM NEGATIVE    AMNISURE RUPTURE OF MEMBRANE (ROM)   Collection Time    05/10/14  6:02 AM      Result Value Ref Range   Amnisure ROM NEGATIVE      Imaging Studies:    Currently EPIC will not allow sonographic studies to automatically populate into notes.  In the meantime, copy and paste results into note or free text.  Medications:  Scheduled . docusate sodium  100 mg Oral Daily  . Influenza vac split quadrivalent PF  0.5 mL Intramuscular Tomorrow-1000  . prenatal multivitamin  1 tablet Oral QHS   I have reviewed the patient's current medications.  ASSESSMENT: Patient Active Problem List   Diagnosis Date Noted  . Decreased fetal movement 05/07/2014  . Supervision of high risk pregnancy, antepartum 03/22/2014  . 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 Followup [facility]C for twice weekly testing, see Monday  Carol [name] 05/10/2014,6:47 AM

## 2014-05-11 ENCOUNTER — Ambulatory Visit (HOSPITAL_COMMUNITY): Payer: Medicaid Other

## 2014-05-11 DIAGNOSIS — O368130 Decreased fetal movements, third trimester, not applicable or unspecified: Secondary | ICD-10-CM

## 2014-05-11 DIAGNOSIS — Z3A32 32 weeks gestation of pregnancy: Secondary | ICD-10-CM

## 2014-05-11 DIAGNOSIS — O4103X0 Oligohydramnios, third trimester, not applicable or unspecified: Secondary | ICD-10-CM

## 2014-05-11 NOTE — Progress Notes (Signed)
I assisted Dr. Leggett this morning with exPenne Lashplanation of care plan.  Carol [name]  Interpreter.  I assisted Research officer, trade unionandra RN, with questions.  Carol [name] Interpreter.

## 2014-05-11 NOTE — Progress Notes (Signed)
I stopped by patient's room to check on her needs I ordered breakfast.By Orlan LeavensViria Alvarez, Interpreter

## 2014-05-11 NOTE — Progress Notes (Signed)
Patient ID: Carol [name], female   DOB: Mar 14, 1996, 18 y.o.   MRN: [id number] FACULTY PRACTICE ANTEPARTUM(COMPREHENSIVE) NOTE  Carol [name] is a 18 y.o. G1P0 at 603w6d  who is admitted for presumed PPROM.   Length of Stay:  4  Days  Subjective: Patient reports the fetal movement as active. Patient reports uterine contraction  activity as rare. Patient reports  vaginal bleeding as none. Patient describes fluid per vagina as None.  Vitals:  Blood pressure 106/64, pulse 75, temperature 98.2 F (36.8 C), temperature source Oral, resp. rate 18, height 5\' 4"  (1.626 m), weight 155 lb (70.308 kg), last menstrual period 09/07/2013, SpO2 100.00%. Physical Examination:  General appearance - alert, well appearing, and in no distress Abdomen - gravid, nontender Extremities - no edema, redness or tenderness in the calves or thighs, Homan's sign negative bilaterally  Fetal Monitoring:  Baseline: 150 bpm, Variability: Good {> 6 bpm), Accelerations: Reactive and Decelerations: Absent  Labs:  Results for orders placed during the hospital encounter of 05/07/14 (from the past 24 hour(s))  TYPE AND SCREEN   Collection Time    05/10/14  8:30 PM      Result Value Ref Range   ABO/RH(D) O POS     Antibody Screen NEG     Sample Expiration 05/13/2014      Imaging Studies:    See MFM note  Medications:  Scheduled . ampicillin (OMNIPEN) IV  2 g Intravenous Q6H   Followed by  . [START ON 05/12/2014] amoxicillin  500 mg Oral Q8H  . azithromycin  500 mg Intravenous Q24H   Followed by  . [START ON 05/12/2014] azithromycin  500 mg Oral Daily  . docusate sodium  100 mg Oral Daily  . Influenza vac split quadrivalent PF  0.5 mL Intramuscular Tomorrow-1000  . prenatal multivitamin  1 tablet Oral QHS   I have reviewed the patient's current medications.  ASSESSMENT: Patient Active Problem List   Diagnosis Date Noted  . Decreased fetal movement 05/07/2014  . Supervision of high risk  pregnancy, antepartum 03/22/2014  . 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 Language barrier 03/02/2014    PLAN: SIUP at 8758w5d on attempted fetal survey for patient admitted with diagnosis of severe oligohydramnios (1.1cm AFI without a 2x2cm pocket---near anhydramnios)  1-Continue latency antibiotics 2-NICU consult 3-Serial speculum exams (last one yesterday) 4-Monitor for signs of chorio  5-plan for delivery at 34 weeks or earlier for emergent condition  Shirleen Mcfaul H. 05/11/2014,7:36 AM

## 2014-05-11 NOTE — Progress Notes (Signed)
I stopped by patien't room to check her needs. By Orlan LeavensViria Alvarez, Interpreter

## 2014-05-11 NOTE — Progress Notes (Signed)
UR completed 

## 2014-05-12 LAB — AMNISURE RUPTURE OF MEMBRANE (ROM) NOT AT ARMC: Amnisure ROM: NEGATIVE

## 2014-05-12 MED ORDER — MENTHOL 3 MG MT LOZG
1.0000 | LOZENGE | OROMUCOSAL | Status: DC | PRN
  Administered 2014-05-12: 3 mg via ORAL
  Filled 2014-05-12: qty 9

## 2014-05-12 NOTE — Progress Notes (Signed)
Via interpreter patient stated she was feeling "pain and tightness in abdomen" that started after she ate dinner; patient rated pain 4/10; patient also stated she has not been feeling baby move much; EFM applied and assessing and strip reactive within 25 minutes with no contractions noted or palpated; will continue to monitor patient for 2 hours per MD order; instructed patient to use call bell if pain becomes worse; patient verbalized understand via interpreter.

## 2014-05-12 NOTE — Progress Notes (Signed)
Speculum exam performed by Dr. Macon LargeAnyanwu. Amniosure sent to lab. Interpreter at bedside prior and during procedure. All questions were answered to patient's satisfaction.

## 2014-05-12 NOTE — Progress Notes (Signed)
Patient ID: Carol [name], female   DOB: September 13, 1995, 18 y.o.   MRN: [id number] FACULTY PRACTICE ANTEPARTUM(COMPREHENSIVE) NOTE  Carol [name] is a 18 y.o. G1P0 at 6347w0d  who is admitted for presumed PPROM.    Length of Stay:  5  Days  Subjective: Patient is Spanish-speaking only, Spanish interpreter present for this encounter. Patient reports the fetal movement as active. Patient reports uterine contraction  activity as rare. Patient reports  vaginal bleeding as none. Patient describes fluid per vagina as None.  Vitals:  Blood pressure 114/56, pulse 95, temperature 98.8 F (37.1 C), temperature source Oral, resp. rate 18, height 5\' 4"  (1.626 m), weight 155 lb (70.308 kg), last menstrual period 09/07/2013, SpO2 100.00%. Physical Examination: General appearance - alert, well appearing, and in no distress Abdomen - gravid, nontender Pelvic/SSE - Scant thick white discharge noted.  Amnisure sample obtained   Extremities - no edema, redness or tenderness in the calves or thighs, Homan's sign negative bilaterally  Fetal Monitoring:  Baseline: 150 bpm, Variability: Good {> 6 bpm), Accelerations: Reactive and Decelerations: Absent  Labs:  No results found for this or any previous visit (from the past 24 hour(s)).  Imaging Studies:    See MFM note  Medications:  Scheduled . amoxicillin  500 mg Oral Q8H  . azithromycin  500 mg Oral Daily  . docusate sodium  100 mg Oral Daily  . Influenza vac split quadrivalent PF  0.5 mL Intramuscular Tomorrow-1000  . prenatal multivitamin  1 tablet Oral QHS   I have reviewed the patient's current medications.  ASSESSMENT: Patient Active Problem List   Diagnosis Date Noted  . Decreased fetal movement 05/07/2014  . Supervision of high risk pregnancy, antepartum 03/22/2014  . 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 Language barrier 03/02/2014    PLAN: Patient admitted with diagnosis of severe oligohydramnios (1.1cm AFI without a  2x2cm pocket---near anhydramnios)  1-Continue latency antibiotics 2-NICU consult pending 3-Serial speculum exams as needed. Follow up Amnisure results. 4-Monitor for signs of chorioamnionitis  5-Plan for delivery at 34 weeks or earlier for emergent condition   Chaunce Winkels A, MD 05/12/2014,2:52 PM

## 2014-05-13 DIAGNOSIS — O4103X1 Oligohydramnios, third trimester, fetus 1: Secondary | ICD-10-CM

## 2014-05-13 LAB — TYPE AND SCREEN
ABO/RH(D): O POS
Antibody Screen: NEGATIVE

## 2014-05-13 NOTE — Progress Notes (Signed)
Assisted RN with interpretations and ordered dinner and breakfast Carol [name] -[employer] Interpreter

## 2014-05-13 NOTE — Progress Notes (Signed)
Assisted RN with interpretations and checked on patients needs. Carol [name] -[employer] Interpreter

## 2014-05-13 NOTE — Progress Notes (Signed)
I stopped by to check on patients needs Carol [name] - Spanish Interpreter

## 2014-05-13 NOTE — Progress Notes (Signed)
Assisted RN in interpretations concerning patient treatment.  Ordered dinner and breakfast.  Joselyn GlassmanBenita Sanchez - Spanish Interpreter

## 2014-05-13 NOTE — Progress Notes (Signed)
Antenatal Nutrition Assessment:  Currently  33 2/[redacted] weeks gestation, with PROM. Height  64 "  Weight 155 lbs  pre-pregnancy weight 148 at initial prenatal visit lbs .  Pre-pregnancy  BMI 25.5  IBW 120 lbs Total weight gain 7.lbs Weight gain goals 15-25 lbs Estimated needs: 1800-2000 kcal/day, 63-73 grams protein/day, 2.1 liters fluid/day  Reular diet  Current diet prescription will provide for increased needs.  No abnormal nutrition related labs  Nutrition Dx: Increased nutrient needs r/t pregnancy and fetal growth requirements aeb [redacted] weeks gestation.  No educational needs assessed at this time.  Carol [name] M.Odis LusterEd. R.D. LDN Neonatal Nutrition Support Specialist/RD III Pager [phone]

## 2014-05-13 NOTE — Progress Notes (Signed)
Patient ID: Carol [name], female   DOB: [birth year], 18 y.o.   MRN: [id number] FACULTY PRACTICE ANTEPARTUM(COMPREHENSIVE) NOTE  Carol [name] is a 18 y.o. G1P0 at 1529w1d  who is admitted for presumed PPROM.    Length of Stay:  6  Days  Subjective: Patient is Spanish-speaking only, Spanish interpreter present for this encounter. She reports some right-sided abdominal tenderness which has been present since the ultrasound on 10/15. Patient reports the fetal movement as active. Patient reports uterine contraction  activity as rare. Patient reports  vaginal bleeding as none. Patient describes fluid per vagina as None.  Vitals:  Blood pressure 119/56, pulse 102, temperature 98 F (36.7 C), temperature source Oral, resp. rate 18, height 5\' 4"  (1.626 m), weight 155 lb (70.308 kg), last menstrual period 09/07/2013, SpO2 100.00%. Physical Examination: General appearance - alert, well appearing, and in no distress Abdomen - gravid, nontender Extremities - no edema, redness or tenderness in the calves or thighs, Homan's sign negative bilaterally  Fetal Monitoring:  Baseline: 150 bpm, Variability: Good {> 6 bpm), Accelerations: Reactive and Decelerations: Absent  Labs:  Results for orders placed during the hospital encounter of 05/07/14 (from the past 24 hour(s))  AMNISURE RUPTURE OF MEMBRANE (ROM)   Collection Time    05/12/14  2:45 PM      Result Value Ref Range   Amnisure ROM NEGATIVE      Imaging Studies:    See MFM note  Medications:  Scheduled . amoxicillin  500 mg Oral Q8H  . azithromycin  500 mg Oral Daily  . docusate sodium  100 mg Oral Daily  . Influenza vac split quadrivalent PF  0.5 mL Intramuscular Tomorrow-1000  . prenatal multivitamin  1 tablet Oral QHS   I have reviewed the patient's current medications.  ASSESSMENT: Patient Active Problem List   Diagnosis Date Noted  . Decreased fetal movement 05/07/2014  . Supervision of high risk pregnancy,  antepartum 03/22/2014  . 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 Language barrier 03/02/2014    PLAN: Patient admitted with diagnosis of severe oligohydramnios (1.1cm AFI without a 2x2cm pocket---near anhydramnios)  1-Continue latency antibiotics 2-NICU consult pending 3-Monitor for signs of chorioamnionitis  4-Plan for delivery at 34 weeks or earlier for emergent condition   Carol Encarnacion, MD 05/13/2014,7:25 AM

## 2014-05-13 NOTE — Progress Notes (Signed)
Assisted RN in interpretations concerning patient treatment and any needs. [name] - [employer] Interpreter

## 2014-05-13 NOTE — Progress Notes (Signed)
Assisted RN in interpretations concerning patient treatment.   °Benita Sanchez - Spanish Interpreter °

## 2014-05-14 MED ORDER — SODIUM CHLORIDE 0.9 % IJ SOLN
3.0000 mL | Freq: Two times a day (BID) | INTRAMUSCULAR | Status: DC
  Administered 2014-05-14 – 2014-05-15 (×4): 3 mL via INTRAVENOUS

## 2014-05-14 NOTE — Progress Notes (Signed)
I stopped by to check on patient's needs.  [name] Interpreter. °

## 2014-05-14 NOTE — Progress Notes (Signed)
Ur chart review completed.  

## 2014-05-14 NOTE — Plan of Care (Signed)
Problem: Consults Goal: Neonatologist Consult Outcome: Progressing Rn spoke with dr Katrinka Blazingsmith, md says nicu will come down for a consult later today.

## 2014-05-14 NOTE — Progress Notes (Signed)
Pt. Needed to talk to our financial counselor, I made sure Pt. Had the information and  left a message for [name] to call the Pt. back. I ordered her lunch.  Carol [name]  Interpreter.

## 2014-05-14 NOTE — Progress Notes (Signed)
Patient ID: Carol [name], female   DOB: July 29, 1995, 18 y.o.   MRN: [id number] FACULTY PRACTICE ANTEPARTUM NOTE  Carol [name] is a 18 y.o. G1P0 at 7961w2d  who is admitted for rupture of membranes.   Fetal presentation is unsure. Length of Stay:  7  Days  Subjective: C/o sl tenderness with abd palpation.  Patient reports good fetal movement.  She reports no uterine contractions, no bleeding and no loss of fluid per vagina.  Vitals:  Blood pressure 122/51, pulse 77, temperature 98.4 F (36.9 C), temperature source Oral, resp. rate 16, height 5\' 4"  (1.626 m), weight 155 lb (70.308 kg), last menstrual period 09/07/2013, SpO2 100.00%. Physical Examination:  General appearance - alert, well appearing, and in no distress, oriented to person, place, and time and well hydrated Fundal Height:  size equals dates Pelvic Exam:  exam declined by the patient Cervical Exam: Not evaluated. and found to be not evaluated/ n/a /n/a and fetal presentation is unsure. Extremities: extremities normal, atraumatic, no cyanosis or edema, Homans sign is negative, no sign of DVT and no edema, redness or tenderness in the calves or thighs with DTRs 2+ bilaterally Membranes:ruptured  Fetal Monitoring:  Cat 1 tracings  Labs:  Results for orders placed during the hospital encounter of 05/07/14 (from the past 24 hour(s))  TYPE AND SCREEN   Collection Time    05/13/14  9:35 PM      Result Value Ref Range   ABO/RH(D) O POS     Antibody Screen NEG     Sample Expiration 05/16/2014      Imaging Studies:    n/a   Medications:  Scheduled . amoxicillin  500 mg Oral Q8H  . azithromycin  500 mg Oral Daily  . docusate sodium  100 mg Oral Daily  . Influenza vac split quadrivalent PF  0.5 mL Intramuscular Tomorrow-1000  . prenatal multivitamin  1 tablet Oral QHS  . sodium chloride  3 mL Intravenous Q12H   I have reviewed the patient's current medications.  ASSESSMENT: Patient Active Problem List   Diagnosis Date Noted  . Decreased fetal movement 05/07/2014  . Supervision of high risk pregnancy, antepartum 03/22/2014  . 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 Language barrier 03/02/2014    PLAN: Expectant management Continue routine antenatal care.   LAWSON, MARIE DARLENE 05/14/2014,7:58 AM

## 2014-05-15 DIAGNOSIS — O4103X0 Oligohydramnios, third trimester, not applicable or unspecified: Secondary | ICD-10-CM | POA: Diagnosis present

## 2014-05-15 DIAGNOSIS — O368190 Decreased fetal movements, unspecified trimester, not applicable or unspecified: Secondary | ICD-10-CM

## 2014-05-15 MED ORDER — INFLUENZA VAC SPLIT QUAD 0.5 ML IM SUSY: 0.5000 mL | PREFILLED_SYRINGE | INTRAMUSCULAR | Status: DC | PRN

## 2014-05-15 NOTE — Progress Notes (Signed)
I assisted Victorino DikeJennifer, RN with questions the patient's had . [name]  Interpreter.

## 2014-05-15 NOTE — Progress Notes (Signed)
I assisted Dr. Macon LargeAnyanwu with explanation of care plan and Carol [name] with questions about her IV. [name] Interpreter.

## 2014-05-15 NOTE — Consult Note (Signed)
Neonatology Consult to Antenatal Patient:  I was asked by Dr. Macon LargeAnyanwu to see this patient in order to provide antenatal counseling due to oligohydramnios and concern for possible PPROM.  Carol [name] was admitted 10/12 and is now 33 3/[redacted] weeks GA. She initially presented with decreased fetal movement, and FHR had recurring spontaneous decelerations. Recent fetal monitoring has shown a reactive fetus. She has not had a gush of fluid, but the AFI is 1. She is currently not having active labor. She has gotten BMZ and is on oral antibiotics. She has a uterus didelphys. The infant is female and this is her first child.  I spoke with the patient alone via Spanish language interpreter. We discussed what is likely to happen with anticipated delivery at 34 weeks (planned induction), including usual DR management, possible respiratory complications and need for support, IV access, feedings (mother desires breast feeding, which was encouraged), LOS, Mortality and Morbidity, and long term outcomes. She had several questions, which I answered. I offered a NICU tour to any interested family members and would be glad to come back if she has more questions later.  Thank you for asking me to see this patient.  Doretha Souhristie C. Asim Gersten, MD Neonatologist  The total length of face-to-face or floor/unit time for this encounter was 25 minutes. Counseling and/or coordination of care was 20 minutes of the above.

## 2014-05-15 NOTE — Progress Notes (Addendum)
Patient ID: Carol [name], female   DOB: 1995/09/18, 18 y.o.   MRN: [id number] FACULTY PRACTICE ANTEPARTUM NOTE  Carol [name] is a 18 y.o. G1P0 at 31101w3d  who is admitted for rupture of membranes.   Fetal presentation is unsure. Length of Stay:  8  Days  Subjective: Patient has no complaint. Wants to know the plan.  Patient is Spanish-speaking only, Spanish interpreter present for this encounter. Patient reports good fetal movement.  She reports no uterine contractions, no bleeding and no loss of fluid per vagina.  Vitals:  Blood pressure 118/69, pulse 91, temperature 98 F (36.7 C), temperature source Oral, resp. rate 18, height 5\' 4"  (1.626 m), weight 155 lb (70.308 kg), last menstrual period 09/07/2013, SpO2 100.00%. Physical Examination: General appearance - alert, well appearing, and in no distress, oriented to person, place, and time and well hydrated Fundal Height:  size equals dates Pelvic Exam:  exam declined by the patient Cervical Exam: Not evaluated. and found to be not evaluated/ n/a /n/a and fetal presentation is unsure. Extremities: extremities normal, atraumatic, no cyanosis or edema, Homans sign is negative, no sign of DVT and no edema, redness or tenderness in the calves or thighs with DTRs 2+ bilaterally Membranes:ruptured  Fetal Monitoring:  Cat 1 tracings  Labs:  No results found for this or any previous visit (from the past 24 hour(s)).  Imaging Studies:    n/a   Medications:  Scheduled . amoxicillin  500 mg Oral Q8H  . azithromycin  500 mg Oral Daily  . docusate sodium  100 mg Oral Daily  . Influenza vac split quadrivalent PF  0.5 mL Intramuscular Tomorrow-1000  . prenatal multivitamin  1 tablet Oral QHS  . sodium chloride  3 mL Intravenous Q12H   I have reviewed the patient's current medications.  ASSESSMENT: Patient Active Problem List   Diagnosis Date Noted  . Anhydramnios in third trimester 05/15/2014  . Decreased fetal movement  05/07/2014  . Supervision of high risk pregnancy, antepartum 03/22/2014  . 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 Language barrier 03/02/2014    PLAN: Expectant management Continue routine antenatal care. Plan is for IOL at 34 weeks as long as the fetal presentation is cephalic.   Zach Tietje A, MD 05/15/2014,1:04 PM

## 2014-05-15 NOTE — Progress Notes (Signed)
Stopped by to check on patient's needs and ordered her lunch. [name]  Interpreter. °

## 2014-05-16 NOTE — Progress Notes (Signed)
I stopped by patients room to check on her needs. By Viria Alvarez, Interpreter °

## 2014-05-16 NOTE — Progress Notes (Signed)
Patient ID: Carol [name], female   DOB: 1996-07-05, 18 y.o.   MRN: [id number] FACULTY PRACTICE ANTEPARTUM NOTE  Carol [name] is a 18 y.o. G1P0 at 8333w4d who is admitted for rupture of membranes.   Fetal presentation is unsure.  Length of Stay:  9  Days  Subjective: Patient has no complaints Patient is Spanish-speaking only, Spanish interpreter present for this encounter. Patient reports good fetal movement.  She reports no uterine contractions, no bleeding and no loss of fluid per vagina.  Vitals:  Blood pressure 115/50, pulse 83, temperature 99.1 F (37.3 C), temperature source Oral, resp. rate 16, height 5\' 4"  (1.626 m), weight 155 lb (70.308 kg), last menstrual period 09/07/2013, SpO2 100.00%. Physical Examination: General appearance - alert, well appearing, and in no distress, oriented to person, place, and time and well hydrated Fundal Height:  size equals dates Pelvic Exam:  exam declined by the patient Cervical Exam: Not evaluated. and found to be not evaluated/ n/a /n/a and fetal presentation is unsure. Extremities: extremities normal, atraumatic, no cyanosis or edema, Homans sign is negative, no sign of DVT and no edema, redness or tenderness in the calves or thighs with DTRs 2+ bilaterally Membranes:ruptured  Fetal Monitoring:  Cat 1 tracings  Labs:  No results found for this or any previous visit (from the past 24 hour(s)).  Imaging Studies:    n/a   Medications:  Scheduled . amoxicillin  500 mg Oral Q8H  . azithromycin  500 mg Oral Daily  . docusate sodium  100 mg Oral Daily  . Influenza vac split quadrivalent PF  0.5 mL Intramuscular Tomorrow-1000  . prenatal multivitamin  1 tablet Oral QHS  . sodium chloride  3 mL Intravenous Q12H   I have reviewed the patient's current medications.  ASSESSMENT: Patient Active Problem List   Diagnosis Date Noted  . Anhydramnios in third trimester 05/15/2014  . Decreased fetal movement 05/07/2014  . Supervision  of high risk pregnancy, antepartum 03/22/2014  . 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 Language barrier 03/02/2014    PLAN: Expectant management Plan is for IOL at 34 weeks as long as the fetal presentation is cephalic; already scheduled on L&D for 05/19/14 at 0730. Appreciate neonatology consultation Continue routine antenatal care.   [name], MD 05/16/2014,5:51 AM

## 2014-05-16 NOTE — Progress Notes (Signed)
Stopped by to check on patient's needs and ordered her lunch. Carol [name]  Interpreter. °

## 2014-05-16 NOTE — Progress Notes (Signed)
I assisted Tammy, RN with questions. [name] Interpreter.

## 2014-05-16 NOTE — Progress Notes (Signed)
I  Assisted Dr Macon LargeAnyanwu with explanation of care plan. By Orlan LeavensViria Alvarez, Interpreter

## 2014-05-17 ENCOUNTER — Encounter: Payer: Medicaid Other | Admitting: Family Medicine

## 2014-05-17 LAB — TYPE AND SCREEN
ABO/RH(D): O POS
Antibody Screen: NEGATIVE

## 2014-05-17 NOTE — Progress Notes (Signed)
UR completed 

## 2014-05-17 NOTE — Progress Notes (Signed)
Patient ID: Carol [name], female   DOB: [birth year], 18 y.o.   MRN: [id number] FACULTY PRACTICE ANTEPARTUM NOTE  Carol [name] is a [age] y.o. G1P0 at 408w5d who is admitted for rupture of membranes.   Fetal presentation is unsure.  Length of Stay:  10  Days  Subjective: Patient has no complaints Patient is Spanish-speaking only, Spanish interpreter present for this encounter. Patient reports good fetal movement.  She reports no uterine contractions, no bleeding and no loss of fluid per vagina.  Vitals:  Blood pressure 111/62, pulse 79, temperature 98.9 F (37.2 C), temperature source Oral, resp. rate 18, height 5\' 4"  (1.626 m), weight 156 lb 11.2 oz (71.079 kg), last menstrual period 09/07/2013, SpO2 100.00%. Physical Examination: General appearance - alert, well appearing, and in no distress, oriented to person, place, and time and well hydrated Fundal Height:  size equals dates Pelvic Exam:  exam not indicated Cervical Exam: Not evaluated.  Extremities: extremities normal, atraumatic, no cyanosis or edema, Homans sign is negative, no sign of DVT and no edema, redness or tenderness in the calves or thighs with DTRs 2+ bilaterally Membranes:ruptured Abd: soft, gravid, NT Fetal Monitoring:  Cat 1 tracings  Labs:  Results for orders placed during the hospital encounter of 05/07/14 (from the past 24 hour(s))  TYPE AND SCREEN   Collection Time    05/16/14  9:50 PM      Result Value Ref Range   ABO/RH(D) O POS     Antibody Screen NEG     Sample Expiration 05/19/2014      Imaging Studies:    n/a   Medications:  Scheduled . docusate sodium  100 mg Oral Daily  . Influenza vac split quadrivalent PF  0.5 mL Intramuscular Tomorrow-1000  . prenatal multivitamin  1 tablet Oral QHS  . sodium chloride  3 mL Intravenous Q12H   I have reviewed the patient's current medications.  ASSESSMENT: Patient Active Problem List   Diagnosis Date Noted  . Anhydramnios in third  trimester 05/15/2014  . Decreased fetal movement 05/07/2014  . Supervision of high risk pregnancy, antepartum 03/22/2014  . 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 Language barrier 03/02/2014    PLAN: Expectant management Plan is for IOL at 34 weeks as long as the fetal presentation is cephalic; already scheduled on L&D for 05/19/14 at 0730. Continue routine antenatal care.   Carol Elias, MD 05/17/2014,11:08 AM

## 2014-05-18 NOTE — Progress Notes (Signed)
Patient ID: Carol [name], female   DOB: 1996-05-05, 18 y.o.   MRN: [id number] FACULTY PRACTICE ANTEPARTUM NOTE  Carol [name] is a 18 y.o. G1P0 at 2102w6d who is admitted for rupture of membranes/anhydramnios.   Fetal presentation is cephalic.  Length of Stay:  11  Days  Subjective: Patient has no complaints Patient is Spanish-speaking only, Spanish interpreter present for this encounter. Patient reports good fetal movement.  She reports no uterine contractions, no bleeding and no loss of fluid per vagina.  Vitals:  Blood pressure 123/65, pulse 84, temperature 98.6 F (37 C), temperature source Oral, resp. rate 18, height 5\' 4"  (1.626 m), weight 156 lb 11.2 oz (71.079 kg), last menstrual period 09/07/2013, SpO2 100.00%. Physical Examination: General appearance - alert, well appearing, and in no distress, oriented to person, place, and time and well hydrated Fundal Height:  size equals dates Pelvic Exam:  exam not indicated Cervical Exam: Not evaluated.  Extremities: extremities normal, atraumatic, no cyanosis or edema, Homans sign is negative, no sign of DVT and no edema, redness or tenderness in the calves or thighs with DTRs 2+ bilaterally Membranes:ruptured Abd: soft, gravid, NT Fetal Monitoring:  Cat 1 tracings  Labs:  No results found for this or any previous visit (from the past 24 hour(s)).  Imaging Studies:   n/a   Medications:  Scheduled . docusate sodium  100 mg Oral Daily  . Influenza vac split quadrivalent PF  0.5 mL Intramuscular Tomorrow-1000  . prenatal multivitamin  1 tablet Oral QHS  . sodium chloride  3 mL Intravenous Q12H   I have reviewed the patient's current medications.  ASSESSMENT: Patient Active Problem List   Diagnosis Date Noted  . Anhydramnios in third trimester 05/15/2014  . Decreased fetal movement 05/07/2014  . Supervision of high risk pregnancy, antepartum 03/22/2014  . 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 Language  barrier 03/02/2014    PLAN: Expectant management Plan is for IOL at 34 weeks as long as the fetal presentation is cephalic; already scheduled on L&D for 05/19/14 at 0730. Will verify fetal presentation on bedside scan prior to IOL. Continue routine antenatal care.   Keonte Daubenspeck A, MD 05/18/2014,7:00 AM

## 2014-05-19 ENCOUNTER — Inpatient Hospital Stay (HOSPITAL_COMMUNITY): Admit: 2014-05-19 | Payer: Medicaid Other

## 2014-05-19 LAB — CBC
HCT: 32.6 % — ABNORMAL LOW (ref 36.0–46.0)
HEMOGLOBIN: 11.1 g/dL — AB (ref 12.0–15.0)
MCH: 31.5 pg (ref 26.0–34.0)
MCHC: 34 g/dL (ref 30.0–36.0)
MCV: 92.6 fL (ref 78.0–100.0)
Platelets: 187 10*3/uL (ref 150–400)
RBC: 3.52 MIL/uL — ABNORMAL LOW (ref 3.87–5.11)
RDW: 13.1 % (ref 11.5–15.5)
WBC: 7.9 10*3/uL (ref 4.0–10.5)

## 2014-05-19 LAB — BASIC METABOLIC PANEL
Anion gap: 11 (ref 5–15)
BUN: 6 mg/dL (ref 6–23)
CHLORIDE: 106 meq/L (ref 96–112)
CO2: 22 mEq/L (ref 19–32)
Calcium: 8.9 mg/dL (ref 8.4–10.5)
Creatinine, Ser: 0.41 mg/dL — ABNORMAL LOW (ref 0.50–1.10)
GFR calc Af Amer: >90 mL/min (ref >90)
GFR calc non Af Amer: >90 mL/min (ref >90)
GLUCOSE: 76 mg/dL (ref 70–99)
POTASSIUM: 3.8 meq/L (ref 3.7–5.3)
SODIUM: 139 meq/L (ref 137–147)

## 2014-05-19 LAB — TYPE AND SCREEN
ABO/RH(D): O POS
Antibody Screen: NEGATIVE

## 2014-05-19 MED ORDER — CITRIC ACID-SODIUM CITRATE 334-500 MG/5ML PO SOLN: 30.0000 mL | ORAL | Status: DC | PRN

## 2014-05-19 MED ORDER — OXYCODONE-ACETAMINOPHEN 5-325 MG PO TABS: 1.0000 | ORAL_TABLET | ORAL | Status: DC | PRN

## 2014-05-19 MED ORDER — FENTANYL 2.5 MCG/ML BUPIVACAINE 1/10 % EPIDURAL INFUSION (WH - ANES)
14.0000 mL/h | INTRAMUSCULAR | Status: DC | PRN
  Administered 2014-05-21 – 2014-05-22 (×5): 14 mL/h via EPIDURAL
  Filled 2014-05-19 (×6): qty 125

## 2014-05-19 MED ORDER — OXYCODONE-ACETAMINOPHEN 5-325 MG PO TABS: 2.0000 | ORAL_TABLET | ORAL | Status: DC | PRN

## 2014-05-19 MED ORDER — TERBUTALINE SULFATE 1 MG/ML IJ SOLN: 0.2500 mg | Freq: Once | INTRAMUSCULAR | Status: AC | PRN

## 2014-05-19 MED ORDER — OXYTOCIN 40 UNITS IN LACTATED RINGERS INFUSION - SIMPLE MED
62.5000 mL/h | INTRAVENOUS | Status: DC
  Administered 2014-05-22: 999 mL/h via INTRAVENOUS

## 2014-05-19 MED ORDER — OXYTOCIN BOLUS FROM INFUSION: 500.0000 mL | INTRAVENOUS | Status: DC

## 2014-05-19 MED ORDER — ONDANSETRON HCL 4 MG/2ML IJ SOLN: 4.0000 mg | Freq: Four times a day (QID) | INTRAMUSCULAR | Status: DC | PRN

## 2014-05-19 MED ORDER — EPHEDRINE 5 MG/ML INJ
10.0000 mg | INTRAVENOUS | Status: DC | PRN
  Filled 2014-05-19: qty 2

## 2014-05-19 MED ORDER — FENTANYL CITRATE 0.05 MG/ML IJ SOLN
100.0000 ug | INTRAMUSCULAR | Status: DC | PRN
  Administered 2014-05-20 – 2014-05-21 (×5): 100 ug via INTRAVENOUS
  Filled 2014-05-19 (×6): qty 2

## 2014-05-19 MED ORDER — MISOPROSTOL 25 MCG QUARTER TABLET
50.0000 ug | ORAL_TABLET | ORAL | Status: DC
  Administered 2014-05-19 (×3): 50 ug via ORAL
  Filled 2014-05-19 (×2): qty 1
  Filled 2014-05-19: qty 0.5
  Filled 2014-05-19: qty 1
  Filled 2014-05-19 (×2): qty 0.5
  Filled 2014-05-19 (×5): qty 1
  Filled 2014-05-19: qty 0.5

## 2014-05-19 MED ORDER — FLEET ENEMA 7-19 GM/118ML RE ENEM: 1.0000 | ENEMA | RECTAL | Status: DC | PRN

## 2014-05-19 MED ORDER — MISOPROSTOL 25 MCG QUARTER TABLET
25.0000 ug | ORAL_TABLET | ORAL | Status: DC
  Administered 2014-05-19 – 2014-05-20 (×2): 25 ug via VAGINAL
  Filled 2014-05-19 (×2): qty 0.25

## 2014-05-19 MED ORDER — DIPHENHYDRAMINE HCL 50 MG/ML IJ SOLN: 12.5000 mg | INTRAMUSCULAR | Status: DC | PRN

## 2014-05-19 MED ORDER — LIDOCAINE HCL (PF) 1 % IJ SOLN
30.0000 mL | INTRAMUSCULAR | Status: DC | PRN
  Filled 2014-05-19: qty 30

## 2014-05-19 MED ORDER — LACTATED RINGERS IV SOLN
500.0000 mL | Freq: Once | INTRAVENOUS | Status: AC
  Administered 2014-05-21: 500 mL via INTRAVENOUS

## 2014-05-19 MED ORDER — PHENYLEPHRINE 40 MCG/ML (10ML) SYRINGE FOR IV PUSH (FOR BLOOD PRESSURE SUPPORT)
80.0000 ug | PREFILLED_SYRINGE | INTRAVENOUS | Status: DC | PRN
  Filled 2014-05-19 (×2): qty 10
  Filled 2014-05-19: qty 2

## 2014-05-19 MED ORDER — LACTATED RINGERS IV SOLN
INTRAVENOUS | Status: DC
  Administered 2014-05-22: 06:00:00 via INTRAVENOUS

## 2014-05-19 MED ORDER — ACETAMINOPHEN 325 MG PO TABS
650.0000 mg | ORAL_TABLET | ORAL | Status: DC | PRN
  Administered 2014-05-21 – 2014-05-22 (×2): 650 mg via ORAL
  Filled 2014-05-19 (×4): qty 2

## 2014-05-19 MED ORDER — PHENYLEPHRINE 40 MCG/ML (10ML) SYRINGE FOR IV PUSH (FOR BLOOD PRESSURE SUPPORT)
80.0000 ug | PREFILLED_SYRINGE | INTRAVENOUS | Status: DC | PRN
  Filled 2014-05-19: qty 2

## 2014-05-19 MED ORDER — LACTATED RINGERS IV SOLN
500.0000 mL | INTRAVENOUS | Status: DC | PRN
  Administered 2014-05-21: 999 mL via INTRAVENOUS
  Administered 2014-05-21: 1000 mL via INTRAVENOUS

## 2014-05-19 NOTE — Progress Notes (Signed)
Patient ID: Carol [name], female   DOB: [birth year], 18 y.o.   MRN: [id number] Carol [name] is a 18 y.o. G1P0 at 7022w0d admitted for induction of labor due to PPROM/anhydramnios @ 34wks.  Subjective: Doing well, no complaints  Objective: BP 119/74  Pulse 82  Temp(Src) 98.1 F (36.7 C) (Oral)  Resp 18  Ht 5\' 4"  (1.626 m)  Wt 71.079 kg (156 lb 11.2 oz)  BMI 26.88 kg/m2  SpO2 100%  LMP 09/07/2013    FHT:  FHR: 145 bpm, variability: moderate,  accelerations:  Present,  decelerations:  Absent UC:   regular, every 2-3 minutes  SVE:   Dilation: Fingertip Effacement (%): 80 Station: -1 Exam by:: OmnicomBooker CNM  Labs: Lab Results  Component Value Date   WBC 7.9 05/19/2014   HGB 11.1* 05/19/2014   HCT 32.6* 05/19/2014   MCV 92.6 05/19/2014   PLT 187 05/19/2014    Assessment / Plan: Just received 4th dose of cytotec, cx still closed- will plan on cervical foley bulb when able  Labor: n/a Fetal Wellbeing:  Category I Pain Control:  n/a Pre-eclampsia: n/a I/D:  n/a Anticipated MOD:  NSVD  [name], [name]  05/19/2014, 11:08 PM

## 2014-05-19 NOTE — Progress Notes (Signed)
Patient ID: Carol [name], female   DOB: [birth year], 18 y.o.   MRN: [id number] FACULTY PRACTICE ANTEPARTUM(COMPREHENSIVE) NOTE  Carol [name] is a 18 y.o. G1P0 at 4949w0d by early ultrasound who is admitted for anhydramnios, with pt now at 34 weeks as scheduled for Induction of labor today.   Fetal presentation is cephalic. Bedside u/s confirms this at 6:40 am today. Length of Stay:  12  Days  Subjective: Pt has been previously informed of plans for IOL today, and she expresses understanding of that plan. She will need further confirmation as induction begun. Patient reports the fetal movement as active. Patient reports uterine contraction  activity as none. Patient reports  vaginal bleeding as none. Patient describes fluid per vagina as None.  Vitals:  Blood pressure 123/60, pulse 83, temperature 98.1 F (36.7 C), temperature source Oral, resp. rate 18, height 5\' 4"  (1.626 m), weight 71.079 kg (156 lb 11.2 oz), last menstrual period 09/07/2013, SpO2 100.00%. Physical Examination:  General appearance - alert, well appearing, and in no distress and well hydrated Heart - normal rate and regular rhythm Abdomen - soft, nontender, nondistended Fundal Height:  size greater than dates Cervical Exam: Not evaluated. and fetal presentation is cephalic.by u/s done this morning at bedside. Extremities: extremities normal, atraumatic, no cyanosis or edema and Homans sign is negative, no sign of DVT with DTRs 2+ bilaterally Membranes:ruptured  Fetal Monitoring:  fhr in normal range by u/s this am. Will document prior to beginning of iol.  Labs:  No results found for this or any previous visit (from the past 24 hour(s)).  Imaging Studies:     Currently EPIC will not allow sonographic studies to automatically populate into notes.  In the meantime, copy and paste results into note or free text.  Medications:  Scheduled . docusate sodium  100 mg Oral Daily  . prenatal multivitamin  1  tablet Oral QHS  . sodium chloride  3 mL Intravenous Q12H   I have reviewed the patient's current medications.  ASSESSMENT: Patient Active Problem List   Diagnosis Date Noted  . Anhydramnios in third trimester 05/15/2014  . Decreased fetal movement 05/07/2014  . Supervision of high risk pregnancy, antepartum 03/22/2014  . 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 Language barrier 03/02/2014    PLAN: Transfer to L&D Induction to begin this morning, after shift change. Type of induction method to be decided after exam.  Deshannon Hinchliffe V 05/19/2014,6:44 AM

## 2014-05-19 NOTE — Progress Notes (Addendum)
Patient ID: Carol [name], female   DOB: 15-Feb-1996, 18 y.o.   MRN: [id number] Carol [name] is a 18 y.o. G1P0 at 5527w0d admitted for induction of labor due to PPROM/anhydramnios.  Subjective: No complaints, denies lof, reports +fm  Objective: BP 123/68  Pulse 94  Temp(Src) 98.2 F (36.8 C) (Oral)  Resp 18  Ht 5\' 4"  (1.626 m)  Wt 71.079 kg (156 lb 11.2 oz)  BMI 26.88 kg/m2  SpO2 100%  LMP 09/07/2013    FHT:  FHR: 140 bpm, variability: moderate,  accelerations:  Present,  decelerations:  Absent UC:   none  SVE:  Cl/th/-3, vtx by u/s this am. No vaginal septum palpated, only 1 cervix noted w/ exam although pt did not tolerate exam well, so I didn't explore too much.   Labs: Lab Results  Component Value Date   WBC 7.9 05/19/2014   HGB 11.1* 05/19/2014   HCT 32.6* 05/19/2014   MCV 92.6 05/19/2014   PLT 187 05/19/2014    Assessment / Plan: IOL d/t PPROM/anhydramnios now at 34wks, will ripen cervix w/ po cytotec, then plan for cervical foley bulb when able  Labor: n/a Fetal Wellbeing:  Category I Pain Control:  n/a Pre-eclampsia: n/a I/D:  n/a Anticipated MOD:  NSVD  Marge DuncansBooker, Rhyan Radler Randall CNM, WHNP-BC 05/19/2014,0945

## 2014-05-19 NOTE — Progress Notes (Signed)
Patient ID: Carol [name], female   DOB: 1995-09-26, 18 y.o.   MRN: [id number] Carol [name] is a 18 y.o. G1P0 at 2564w0d admitted for induction of labor due to PPROM/anhydramnios @ 34wks.  Subjective: Doing well, no complaints  Objective: BP 126/78  Pulse 82  Temp(Src) 98 F (36.7 C) (Oral)  Resp 18  Ht 5\' 4"  (1.626 m)  Wt 71.079 kg (156 lb 11.2 oz)  BMI 26.88 kg/m2  SpO2 100%  LMP 09/07/2013    FHT:  FHR: 150 bpm, variability: moderate,  accelerations:  Present,  decelerations:  Absent UC:   regular, every 2-3 minutes  SVE:   Dilation: Closed Effacement (%): 60 Station: -2 Exam by:: J.Cox, RN  Labs: Lab Results  Component Value Date   WBC 7.9 05/19/2014   HGB 11.1* 05/19/2014   HCT 32.6* 05/19/2014   MCV 92.6 05/19/2014   PLT 187 05/19/2014    Assessment / Plan: Just received 3rd dose of po cytotec, cx still closed- will plan on cervical foley bulb when able  Labor: n/a Fetal Wellbeing:  Category I Pain Control:  n/a Pre-eclampsia: n/a I/D:  n/a Anticipated MOD:  NSVD  Marge DuncansBooker, Khyle Goodell Randall CNM, WHNP-BC 05/19/2014, 7:12 PM

## 2014-05-19 NOTE — Progress Notes (Signed)
Patient ID: Carol [name], female   DOB: 01-15-1996, 18 y.o.   MRN: [id number] Carol [name] is a 18 y.o. G1P0 at 6730w0d admitted for induction of labor due to PPROM/anhydramnios at 34wks.  Subjective: UCs are slightly uncomfortable, has some slight bloody show when wiping  Objective: BP 117/61  Pulse 79  Temp(Src) 98.4 F (36.9 C) (Oral)  Resp 18  Ht 5\' 4"  (1.626 m)  Wt 71.079 kg (156 lb 11.2 oz)  BMI 26.88 kg/m2  SpO2 100%  LMP 09/07/2013    FHT:  FHR: 145 bpm, variability: moderate,  accelerations:  Present,  decelerations:  Absent UC:   regular, every 1-4 minutes  SVE:   Dilation: Fingertip Effacement (%): 80 Station: -1 Exam by:: Carletha Dawn CNM Pt does have 2 cervices, we have been checking Lt cervix all day, Lt cx: able to fit 1 finger all the way through, but uterus was empty- was finally able to feel Rt cervix, which is way off to pt's right- very difficult to get to, ft/80/-1 vtx low but cx very lateral & posterior.  Cytotec 25mcg placed in posterior fornix  Labs: Lab Results  Component Value Date   WBC 7.9 05/19/2014   HGB 11.1* 05/19/2014   HCT 32.6* 05/19/2014   MCV 92.6 05/19/2014   PLT 187 05/19/2014    Assessment / Plan: IOL d/t PPROM/anhydramnios, s/p 3 oral cytotec- will try vaginal cytotec until able to reach cx/cx opened more to fit foley bulb in  Labor: n/a Fetal Wellbeing:  Category I Pain Control:  n/a Pre-eclampsia: n/a I/D:  n/a Anticipated MOD:  NSVD  Marge DuncansBooker, Dakotah Orrego Randall CNM, WHNP-BC 05/19/2014, 11:04 PM

## 2014-05-20 MED ORDER — MISOPROSTOL 25 MCG QUARTER TABLET
50.0000 ug | ORAL_TABLET | ORAL | Status: DC
  Administered 2014-05-20 – 2014-05-21 (×4): 50 ug via ORAL
  Filled 2014-05-20 (×3): qty 1
  Filled 2014-05-20: qty 0.5
  Filled 2014-05-20: qty 1
  Filled 2014-05-20: qty 0.5
  Filled 2014-05-20 (×4): qty 1
  Filled 2014-05-20: qty 0.5
  Filled 2014-05-20 (×5): qty 1

## 2014-05-20 NOTE — Progress Notes (Signed)
Assisted in RN update [name] - Spanish Interpreter

## 2014-05-20 NOTE — Progress Notes (Signed)
Assisted with ordering dinner and breakfast. Joselyn GlassmanBenita Sanchez - Spanish Interpreter

## 2014-05-20 NOTE — Progress Notes (Signed)
Assisted in RN shift change update [name] - Spanish Interpreter

## 2014-05-20 NOTE — Progress Notes (Signed)
Assisted in RN in patient update.  [name] - [employer]

## 2014-05-20 NOTE — Progress Notes (Signed)
Assisted in RN interpretation of patient assessment. Carol [name] - [employer] Inter[employer]

## 2014-05-20 NOTE — Progress Notes (Signed)
LABOR PROGRESS NOTE  Carol [name] is a 18 y.o. G1P0 at 656w1d  admitted for induction of labor due to anhydramnios, suspected PPROM.  Subjective: Overall comfortable, feeling some tightness  Objective: BP 118/75  Pulse 105  Temp(Src) 98.5 F (36.9 C) (Oral)  Resp 20  Ht 5\' 4"  (1.626 m)  Wt 156 lb 11.2 oz (71.079 kg)  BMI 26.88 kg/m2  SpO2 100%  LMP 09/07/2013 or  Filed Vitals:   05/20/14 0306 05/20/14 0513 05/20/14 0827 05/20/14 1118  BP: 124/77 127/76 130/82 118/75  Pulse: 84 101 104 105  Temp: 98.1 F (36.7 C) 98 F (36.7 C) 98.5 F (36.9 C)   TempSrc: Oral Oral Oral   Resp: 20 20    Height:      Weight:      SpO2:           FHT:  FHR: 140 bpm, variability: moderate,  accelerations:  Abscent,  decelerations:  Absent UC:   None, uterine irritability noted SVE:   Dilation: Fingertip Effacement (%): 80 Station: -1 Exam by:: Loreta AveAcosta  Dilation: Fingertip Effacement (%): 80 Cervical Position: Posterior Station: -1 Presentation: Vertex Exam by:: Loreta AveAcosta  Labs: Lab Results  Component Value Date   WBC 7.9 05/19/2014   HGB 11.1* 05/19/2014   HCT 32.6* 05/19/2014   MCV 92.6 05/19/2014   PLT 187 05/19/2014    Assessment / Plan: Induction of labor due to anhydramnios,  progressing well on pitocin  Labor: progressing normally, not tolerating exams at all.  will continue with cytotec, at 2000 will attempt exam with speculum to place foley balloon.  Fetal Wellbeing:  Category I Pain Control:  Labor support without medications Anticipated MOD:  NSVD.  Patient very concerned as to why induction is taking so long, wondering if she can have a break until tomorrow or when she will have a cesarean section.  Advised that this is a slow process, some cases may take days especially as she is G1 and 34w.  Perry MountACOSTA,Pepper Wyndham ROCIO, MD 05/20/2014, 12:58 PM

## 2014-05-20 NOTE — Progress Notes (Signed)
Patient ID: Carol [name], female   DOB: 1995-11-29, 18 y.o.   MRN: [id number] Carol [name] is a 18 y.o. G1P0 at 2781w1d admitted for induction of labor due to PPROM/anhydramnios @ 34wks.  Subjective: Slightly uncomfortable w/ uc's  Objective: BP 127/76  Pulse 101  Temp(Src) 98 F (36.7 C) (Oral)  Resp 20  Ht 5\' 4"  (1.626 m)  Wt 71.079 kg (156 lb 11.2 oz)  BMI 26.88 kg/m2  SpO2 100%  LMP 09/07/2013    FHT:  FHR: 147 bpm, variability: moderate,  accelerations:  Present,  decelerations:  Absent UC:   regular, every 1-5 minutes  SVE:   Dilation: Fingertip Effacement (%): 80 Station: -1 Exam by:: Foday Cone CNM  Attempted foley bulb w/ cook balloon w/ stylet, Rt cx is almost at a direct 90 angle from vagina- attempts unsuccessful by myself, cytotec 25mcg pv placed by Dr. Jimmey RalphParker  Labs: Lab Results  Component Value Date   WBC 7.9 05/19/2014   HGB 11.1* 05/19/2014   HCT 32.6* 05/19/2014   MCV 92.6 05/19/2014   PLT 187 05/19/2014    Assessment / Plan: IOL d/t PPROM/anhydramnios, s/p 3 cytotec po, 2nd vaginal cytotec just placed- will continue cytotec until able to place foley bulb  Labor: n/a Fetal Wellbeing:  Category I Pain Control:  n/a Pre-eclampsia: n/a I/D:  n/a Anticipated MOD:  NSVD  Marge DuncansBooker, Delphine Sizemore Randall CNM, WHNP-BC 05/20/2014, 5:15 AM

## 2014-05-20 NOTE — Progress Notes (Signed)
Assisted in DR in assessment Carol [name] - Spanish Interpreter

## 2014-05-20 NOTE — Progress Notes (Signed)
LABOR PROGRESS NOTE  Carol [name] is a 18 y.o. G1P0 at 3961w1d  admitted for induction of labor due to anhydramnios, suspected PPROM.  Subjective: Reporting leakage, concerned when this will be over, overall comfortable  Objective: BP 128/80  Pulse 101  Temp(Src) 98.6 F (37 C) (Oral)  Resp 20  Ht 5\' 4"  (1.626 m)  Wt 156 lb 11.2 oz (71.079 kg)  BMI 26.88 kg/m2  SpO2 100%  LMP 09/07/2013 or  Filed Vitals:   05/20/14 1118 05/20/14 1640 05/20/14 2036 05/20/14 2042  BP: 118/75 130/83 140/88 128/80  Pulse: 105 87 101 101  Temp:  98.7 F (37.1 C) 98.6 F (37 C)   TempSrc:  Oral Oral   Resp:   20   Height:      Weight:      SpO2:           FHT:  FHR: 145 bpm, variability: moderate,  accelerations:  present,  decelerations:  Absent UC:    uterine irritability noted, intermittent contractions .SVE:   Dilation: 1 Effacement (%): 90 Station: -1 Exam by:: Dr Loreta AveAcosta  Dilation: 1 Effacement (%): 90 Cervical Position: Posterior Station: -1 Presentation: Vertex Exam by:: Dr Loreta AveAcosta  Labs: Lab Results  Component Value Date   WBC 7.9 05/19/2014   HGB 11.1* 05/19/2014   HCT 32.6* 05/19/2014   MCV 92.6 05/19/2014   PLT 187 05/19/2014    Assessment / Plan: Induction of labor due to anhydramnios,  progressing well on pitocin  Labor: progressing normally, not tolerating exams at all, required 100mcg fentanyl just to perform exam.  Foley catheter placed in right cervix without complication @2030 , pt has thin vaginal septum. Fetal Wellbeing:  Category I Pain Control:  Labor support without medications, 100mcg fentanyl given for exam only Anticipated MOD:  NSVD.   Perry MountACOSTA,Carol [name] ROCIO, MD 05/20/2014, 11:08 PM

## 2014-05-20 NOTE — Progress Notes (Signed)
Assisted in ordering snack and checking on patient. [name] - [employer]

## 2014-05-20 NOTE — Progress Notes (Signed)
Assisted RN with assessment Carol [name] - Spanish Interpreter

## 2014-05-21 ENCOUNTER — Encounter (HOSPITAL_COMMUNITY): Payer: Medicaid Other | Admitting: Anesthesiology

## 2014-05-21 ENCOUNTER — Inpatient Hospital Stay (HOSPITAL_COMMUNITY): Payer: Medicaid Other | Admitting: Anesthesiology

## 2014-05-21 ENCOUNTER — Encounter (HOSPITAL_COMMUNITY): Payer: Self-pay | Admitting: Anesthesiology

## 2014-05-21 LAB — CBC
HCT: 31.4 % — ABNORMAL LOW (ref 36.0–46.0)
HEMOGLOBIN: 10.7 g/dL — AB (ref 12.0–15.0)
MCH: 31.2 pg (ref 26.0–34.0)
MCHC: 34.1 g/dL (ref 30.0–36.0)
MCV: 91.5 fL (ref 78.0–100.0)
Platelets: 147 10*3/uL — ABNORMAL LOW (ref 150–400)
RBC: 3.43 MIL/uL — AB (ref 3.87–5.11)
RDW: 13.1 % (ref 11.5–15.5)
WBC: 7.5 10*3/uL (ref 4.0–10.5)

## 2014-05-21 MED ORDER — TERBUTALINE SULFATE 1 MG/ML IJ SOLN: 0.2500 mg | Freq: Once | INTRAMUSCULAR | Status: AC | PRN

## 2014-05-21 MED ORDER — ACETAMINOPHEN 500 MG PO TABS
1000.0000 mg | ORAL_TABLET | Freq: Once | ORAL | Status: AC
  Administered 2014-05-21: 1000 mg via ORAL
  Filled 2014-05-21: qty 2

## 2014-05-21 MED ORDER — LIDOCAINE HCL (PF) 1 % IJ SOLN
INTRAMUSCULAR | Status: DC | PRN
  Administered 2014-05-21 (×2): 4 mL

## 2014-05-21 MED ORDER — OXYTOCIN 40 UNITS IN LACTATED RINGERS INFUSION - SIMPLE MED: 1.0000 m[IU]/min | INTRAVENOUS | Status: DC

## 2014-05-21 MED ORDER — GENTAMICIN SULFATE 40 MG/ML IJ SOLN
140.0000 mg | Freq: Three times a day (TID) | INTRAVENOUS | Status: DC
  Administered 2014-05-21 – 2014-05-22 (×4): 140 mg via INTRAVENOUS
  Filled 2014-05-21 (×5): qty 3.5

## 2014-05-21 MED ORDER — SODIUM CHLORIDE 0.9 % IV SOLN
2.0000 g | Freq: Four times a day (QID) | INTRAVENOUS | Status: DC
  Administered 2014-05-21 – 2014-05-22 (×4): 2 g via INTRAVENOUS
  Filled 2014-05-21 (×7): qty 2000

## 2014-05-21 MED ORDER — FENTANYL 2.5 MCG/ML BUPIVACAINE 1/10 % EPIDURAL INFUSION (WH - ANES)
INTRAMUSCULAR | Status: DC | PRN
  Administered 2014-05-21: 14 mL/h via EPIDURAL

## 2014-05-21 MED ORDER — OXYTOCIN 40 UNITS IN LACTATED RINGERS INFUSION - SIMPLE MED
1.0000 m[IU]/min | INTRAVENOUS | Status: DC
  Administered 2014-05-21: 2 m[IU]/min via INTRAVENOUS
  Filled 2014-05-21: qty 1000

## 2014-05-21 NOTE — Progress Notes (Signed)
Per CNM, pt to receive epidural and RN is to call resident when comfortable to verify presentation per ultrasound prior to the initiation of pitocin.

## 2014-05-21 NOTE — Progress Notes (Signed)
LABOR PROGRESS NOTE  Dianara Angeles Berneda RoseMoran is a 18 y.o. G1P0 at 758w1d  admitted for induction of labor due to anhydramnios, suspected PPROM.  Subjective: No complaints  Objective: BP 130/78  Pulse 110  Temp(Src) 98.7 F (37.1 C) (Oral)  Resp 18  Ht 5\' 4"  (1.626 m)  Wt 71.079 kg (156 lb 11.2 oz)  BMI 26.88 kg/m2  SpO2 100%  LMP 09/07/2013 or  Filed Vitals:   05/20/14 2036 05/20/14 2042 05/21/14 0032 05/21/14 0236  BP: 140/88 128/80 133/86 130/78  Pulse: 101 101 115 110  Temp: 98.6 F (37 C)  99.2 F (37.3 C) 98.7 F (37.1 C)  TempSrc: Oral  Oral Oral  Resp: 20  22 18   Height:      Weight:      SpO2:           FHT:  FHR: 145 bpm, variability: moderate,  accelerations:  present,  decelerations:  Absent UC:    q2-4 minutes SVE:   Dilation: 1 Effacement (%): 90 Station: -1 Exam by:: Dr Loreta AveAcosta  Dilation: 1 Effacement (%): 90 Cervical Position: Posterior Station: -1 Presentation: Vertex Exam by:: Dr Loreta AveAcosta  Labs: Lab Results  Component Value Date   WBC 7.9 05/19/2014   HGB 11.1* 05/19/2014   HCT 32.6* 05/19/2014   MCV 92.6 05/19/2014   PLT 187 05/19/2014    Assessment / Plan: Induction of labor due to anhydramnios  Labor: progressing normally, not tolerating exams at all, required 100mcg fentanyl just to perform exam.  Foley catheter placed in right cervix without complication @2030 , pt has thin vaginal septum., 50mg  PO cytotec just given @0300  Fetal Wellbeing:  Category I Pain Control:  Labor support without medications, 100mcg fentanyl given for exam only Anticipated MOD:  NSVD.   Jacquiline DoeParker, Jailani Hogans, MD 05/21/2014, 3:23 AM

## 2014-05-21 NOTE — Anesthesia Preprocedure Evaluation (Signed)

## 2014-05-21 NOTE — Progress Notes (Signed)
Ane PaymentMcEachern, MD, notified that patient is comfortable with her epidural and that presentation needs to be verified prior to pitocin. McEachern to come evaluate and scan patient.

## 2014-05-21 NOTE — Progress Notes (Signed)
LABOR PROGRESS NOTE  Carol [name] is a 18 y.o. G1P0 at 5524w2d  admitted for induction of labor due to PPROM.  Subjective: More comfortable with epidural.   Objective: BP 100/58  Pulse 122  Temp(Src) 100.1 F (37.8 C) (Axillary)  Resp 20  Ht 5\' 4"  (1.626 m)  Wt 156 lb 11.2 oz (71.079 kg)  BMI 26.88 kg/m2  SpO2 95%  LMP 09/07/2013 or  Filed Vitals:   05/21/14 2101 05/21/14 2130 05/21/14 2201 05/21/14 2213  BP: 126/59 118/51 100/58   Pulse: 128 123 122   Temp:    100.1 F (37.8 C)  TempSrc:    Axillary  Resp:      Height:      Weight:      SpO2:           FHT:  FHR: 170 bpm, variability: moderate,  accelerations:  Present,  decelerations:  Absent UC:   irregular SVE:   Dilation: 3.5 Effacement (%): 80 Station: -1 Exam by:: Dr. Ane PaymentMcEachern  Dilation: 3.5 Effacement (%): 80 Cervical Position: Middle Station: -1 Presentation: Vertex Exam by:: Dr. Ane PaymentMcEachern  Labs: Lab Results  Component Value Date   WBC 7.5 05/21/2014   HGB 10.7* 05/21/2014   HCT 31.4* 05/21/2014   MCV 91.5 05/21/2014   PLT 147* 05/21/2014    Assessment / Plan: Induction of labor, progressing slowly.   Labor: Status post cytotec x 9, foley bulb. Pitocin was stopped secondary to fetal tachycardia, now improved. Restart pitocin and monitor labor curve.  Chorioamnionitis: Maternal fever with maternal and fetal tachycardia. Status post tylenol 650 mg once, 1L LR bolus, Ampicillin/Gentamicin. Monitor fever curve.  Fetal Wellbeing:  Category I Pain Control:  Epidural Anticipated MOD:  NSVD  William DaltonMcEachern, Amzie Sillas MD 05/21/2014, 10:17 PM

## 2014-05-21 NOTE — Progress Notes (Signed)
  Subjective: More comfortable with epidural. No other complaints.   Objective: BP 125/81  Pulse 107  Temp(Src) 98.4 F (36.9 C) (Oral)  Resp 18  Ht 5\' 4"  (1.626 m)  Wt 156 lb 11.2 oz (71.079 kg)  BMI 26.88 kg/m2  SpO2 96%  LMP 09/07/2013      FHT:  FHR: 150's bpm, variability: moderate,  accelerations:  Present,  decelerations:  Absent UC:   irregular, every 3-7 minutes SVE:   Dilation: 3.5 Effacement (%): 50 Station: -1 Exam by:: Carol [name] Bedside ultrasound confirms vertex.   Labs: Lab Results  Component Value Date   WBC 7.9 05/19/2014   HGB 11.1* 05/19/2014   HCT 32.6* 05/19/2014   MCV 92.6 05/19/2014   PLT 187 05/19/2014    Assessment / Plan: 18 yo G1P0 at 2661w2d wks IUP PPROM Category I FHR Tracing  Labor: Progressing normally. Start pitocin now.  Preeclampsia:  n/a Fetal Wellbeing:  Category I Pain Control:  Epidural I/D:  GBS neg Anticipated MOD:  NSVD  Carol [name], Carol [name] 05/21/2014, 3:10 PM

## 2014-05-21 NOTE — Progress Notes (Signed)
Pt calls out and says that she has been shaking "since you gave me the shot".  Gentamycin paused briefly, pharmacy consulted and interpretor called to come to room.  Pharmacy says this should not be a reaction to the antibiotic. Gentamycin to be restarted.

## 2014-05-21 NOTE — Progress Notes (Addendum)
Patient stated she can't stop shaking and is feeling short of breath per interpreter. Ane PaymentMcEachern, MD, and spanish interpreter at bedside. McEachern reviewed FHR tracing. Orders received to give 1000 ml IV fluid bolus and continue to monitor.

## 2014-05-21 NOTE — Progress Notes (Signed)
Spanish interpreter present for epidural placement

## 2014-05-21 NOTE — Progress Notes (Signed)
ANTIBIOTIC CONSULT NOTE - INITIAL  Pharmacy Consult for Gentamicin Indication: R/O Chorioamnionitis  No Known Allergies  Patient Measurements: Height: 5\' 4"  (162.6 cm) Weight: 156 lb 11.2 oz (71.079 kg) IBW/kg (Calculated) : 54.7 Adjusted Body Weight: 59.6kg  Vital Signs: Temp: 98.4 F (36.9 C) (10/26 1701) Temp Source: Oral (10/26 1701) BP: 129/86 mmHg (10/26 1730) Pulse Rate: 102 (10/26 1730) Intake/Output from previous day:   Intake/Output from this shift: Total I/O In: -  Out: 1175 [Urine:1175]  Labs:  Recent Labs  05/19/14 0855  WBC 7.9  HGB 11.1*  PLT 187  CREATININE 0.41*   Estimated Creatinine Clearance: 108.6 ml/min (by C-G formula based on Cr of 0.41).   Microbiology: Recent Results (from the past 720 hour(s))  OB RESULTS CONSOLE GBS     Status: None   Collection Time    05/07/14 12:00 AM      Result Value Ref Range Status   GBS Negative   Final  CULTURE, BETA STREP (GROUP B ONLY)     Status: None   Collection Time    05/07/14 10:40 PM      Result Value Ref Range Status   Specimen Description VAGINAL/RECTAL   Final   Special Requests NONE   Final   Culture     Final   Value: NO GROUP B STREP (S.AGALACTIAE) ISOLATED     Performed at [facility]   Report Status 05/09/2014 FINAL   Final    Medical History: Past Medical History  Diagnosis Date  . Medical history non-contributory   . 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 Ampicillin and Gentamicin initiated due to maternal temp during labor to r/o chorioamnionitis.  Goal of Therapy:  Gentamicin peaks 6-748mcg/ml and trough < 241mcg/ml.   Plan:  1. Gentamicin 140mg  IV q8h. 2. Will continue to follow and check Gentamicin levels if > 72 hours duration.  Thanks!  Claybon JabsAngel, Harley Fitzwater G 05/21/2014,5:35 PM

## 2014-05-21 NOTE — Progress Notes (Signed)
Ane PaymentMcEachern, MD, notified of maternal temperature of 100.7 oral. Orders received to give 650 mg of tylenol, 1 liter IV fluid bolus, and MD to place antibiotic orders.

## 2014-05-21 NOTE — Progress Notes (Signed)
Interpreter at bedside for shift assessment. 

## 2014-05-21 NOTE — Anesthesia Procedure Notes (Signed)
Epidural Patient location during procedure: OB Start time: 05/21/2014 12:25 PM  Staffing Anesthesiologist: Cabrina Shiroma A. Performed by: anesthesiologist   Preanesthetic Checklist Completed: patient identified, site marked, surgical consent, pre-op evaluation, timeout performed, IV checked, risks and benefits discussed and monitors and equipment checked  Epidural Patient position: sitting Prep: site prepped and draped and DuraPrep Patient monitoring: continuous pulse ox and blood pressure Approach: midline Location: L4-L5 Injection technique: LOR air  Needle:  Needle type: Tuohy  Needle gauge: 17 G Needle length: 9 cm and 9 Needle insertion depth: 5 cm cm Catheter type: closed end flexible Catheter size: 19 Gauge Catheter at skin depth: 10 cm Test dose: negative and Other  Assessment Events: blood not aspirated, injection not painful, no injection resistance, negative IV test and no paresthesia  Additional Notes Patient identified. Risks and benefits discussed including failed block, incomplete  Pain control, post dural puncture headache, nerve damage, paralysis, blood pressure Changes, nausea, vomiting, reactions to medications-both toxic and allergic and post Partum back pain. All questions were answered. Patient expressed understanding and wished to proceed. Sterile technique was used throughout procedure. Epidural site was Dressed with sterile barrier dressing. No paresthesias, signs of intravascular injection Or signs of intrathecal spread were encountered.  Patient was more comfortable after the epidural was dosed. Please see RN's note for documentation of vital signs and FHR which are stable.

## 2014-05-21 NOTE — Progress Notes (Signed)
  Subjective: Pt reports same level of pain.  Desires pain medication for cervical check.  Requests epidural if possible.    Objective: BP 131/68  Pulse 121  Temp(Src) 97.6 F (36.4 C) (Axillary)  Resp 18  Ht 5\' 4"  (1.626 m)  Wt 71.079 kg (156 lb 11.2 oz)  BMI 26.88 kg/m2  SpO2 100%  LMP 09/07/2013      FHT:  FHR: 150's bpm, variability: moderate,  accelerations:  Present,  decelerations:  Absent UC:   irregular, every 3-7 minutes SVE:   Dilation: 3.5 Effacement (%): 90 Station: -1 Exam by:: Muhammad, CNM Foley bulb in vagina; removed without difficulty.  Labs: Lab Results  Component Value Date   WBC 7.9 05/19/2014   HGB 11.1* 05/19/2014   HCT 32.6* 05/19/2014   MCV 92.6 05/19/2014   PLT 187 05/19/2014    Assessment / Plan: 18 yo G1P0 at 251w2d wks IUP PPROM Category I FHR Tracing  Labor: Progressing normally Preeclampsia:  n/a Fetal Wellbeing:  Category I Pain Control:  Fentanyl I/D:  GBS neg Anticipated MOD:  NSVD  Begin pitocin after epidural  Rochele PagesKARIM, WALIDAH N 05/21/2014, 11:51 AM

## 2014-05-22 ENCOUNTER — Encounter (HOSPITAL_COMMUNITY): Payer: Self-pay | Admitting: *Deleted

## 2014-05-22 DIAGNOSIS — Z3A34 34 weeks gestation of pregnancy: Secondary | ICD-10-CM

## 2014-05-22 DIAGNOSIS — O368130 Decreased fetal movements, third trimester, not applicable or unspecified: Secondary | ICD-10-CM

## 2014-05-22 DIAGNOSIS — O42913 Preterm premature rupture of membranes, unspecified as to length of time between rupture and onset of labor, third trimester: Secondary | ICD-10-CM

## 2014-05-22 DIAGNOSIS — Z3A32 32 weeks gestation of pregnancy: Secondary | ICD-10-CM

## 2014-05-22 MED ORDER — ZOLPIDEM TARTRATE 5 MG PO TABS: 5.0000 mg | ORAL_TABLET | Freq: Every evening | ORAL | Status: DC | PRN

## 2014-05-22 MED ORDER — TETANUS-DIPHTH-ACELL PERTUSSIS 5-2.5-18.5 LF-MCG/0.5 IM SUSP: 0.5000 mL | Freq: Once | INTRAMUSCULAR | Status: DC

## 2014-05-22 MED ORDER — DIPHENHYDRAMINE HCL 25 MG PO CAPS: 25.0000 mg | ORAL_CAPSULE | Freq: Four times a day (QID) | ORAL | Status: DC | PRN

## 2014-05-22 MED ORDER — IBUPROFEN 600 MG PO TABS
600.0000 mg | ORAL_TABLET | Freq: Four times a day (QID) | ORAL | Status: DC
  Administered 2014-05-22 – 2014-05-24 (×7): 600 mg via ORAL
  Filled 2014-05-22 (×7): qty 1

## 2014-05-22 MED ORDER — BENZOCAINE-MENTHOL 20-0.5 % EX AERO
1.0000 "application " | INHALATION_SPRAY | CUTANEOUS | Status: DC | PRN
  Administered 2014-05-22: 1 via TOPICAL
  Filled 2014-05-22: qty 56

## 2014-05-22 MED ORDER — SIMETHICONE 80 MG PO CHEW: 80.0000 mg | CHEWABLE_TABLET | ORAL | Status: DC | PRN

## 2014-05-22 MED ORDER — WITCH HAZEL-GLYCERIN EX PADS: 1.0000 "application " | MEDICATED_PAD | CUTANEOUS | Status: DC | PRN

## 2014-05-22 MED ORDER — SENNOSIDES-DOCUSATE SODIUM 8.6-50 MG PO TABS
2.0000 | ORAL_TABLET | ORAL | Status: DC
  Administered 2014-05-22 – 2014-05-23 (×2): 2 via ORAL
  Filled 2014-05-22 (×2): qty 2

## 2014-05-22 MED ORDER — ONDANSETRON HCL 4 MG PO TABS: 4.0000 mg | ORAL_TABLET | ORAL | Status: DC | PRN

## 2014-05-22 MED ORDER — FENTANYL CITRATE 0.05 MG/ML IJ SOLN
100.0000 ug | Freq: Once | INTRAMUSCULAR | Status: AC
  Administered 2014-05-22: 100 ug via INTRAVENOUS

## 2014-05-22 MED ORDER — MISOPROSTOL 200 MCG PO TABS
ORAL_TABLET | ORAL | Status: AC
  Filled 2014-05-22: qty 5

## 2014-05-22 MED ORDER — OXYCODONE-ACETAMINOPHEN 5-325 MG PO TABS: 2.0000 | ORAL_TABLET | ORAL | Status: DC | PRN

## 2014-05-22 MED ORDER — PRENATAL MULTIVITAMIN CH
1.0000 | ORAL_TABLET | Freq: Every day | ORAL | Status: DC
  Administered 2014-05-23 – 2014-05-24 (×2): 1 via ORAL
  Filled 2014-05-22 (×2): qty 1

## 2014-05-22 MED ORDER — MISOPROSTOL 200 MCG PO TABS
1000.0000 ug | ORAL_TABLET | Freq: Once | ORAL | Status: AC
  Administered 2014-05-22: 1000 ug via ORAL

## 2014-05-22 MED ORDER — OXYCODONE-ACETAMINOPHEN 5-325 MG PO TABS
1.0000 | ORAL_TABLET | ORAL | Status: DC | PRN
  Administered 2014-05-23: 1 via ORAL
  Filled 2014-05-22: qty 1

## 2014-05-22 MED ORDER — ACETAMINOPHEN 325 MG PO TABS
325.0000 mg | ORAL_TABLET | Freq: Once | ORAL | Status: AC
  Administered 2014-05-22: 325 mg via ORAL
  Filled 2014-05-22: qty 1

## 2014-05-22 MED ORDER — GENTAMICIN SULFATE 40 MG/ML IJ SOLN
Freq: Three times a day (TID) | INTRAMUSCULAR | Status: DC
  Administered 2014-05-23: 04:00:00 via INTRAVENOUS
  Filled 2014-05-22 (×4): qty 3.5

## 2014-05-22 MED ORDER — ONDANSETRON HCL 4 MG/2ML IJ SOLN: 4.0000 mg | INTRAMUSCULAR | Status: DC | PRN

## 2014-05-22 MED ORDER — DIBUCAINE 1 % RE OINT: 1.0000 "application " | TOPICAL_OINTMENT | RECTAL | Status: DC | PRN

## 2014-05-22 MED ORDER — LANOLIN HYDROUS EX OINT: TOPICAL_OINTMENT | CUTANEOUS | Status: DC | PRN

## 2014-05-22 MED ORDER — LACTATED RINGERS IV SOLN
INTRAVENOUS | Status: DC
  Administered 2014-05-22: 300 mL via INTRAUTERINE

## 2014-05-22 MED ORDER — CLINDAMYCIN PHOSPHATE 900 MG/50ML IV SOLN
900.0000 mg | Freq: Three times a day (TID) | INTRAVENOUS | Status: DC
  Administered 2014-05-22: 900 mg via INTRAVENOUS
  Filled 2014-05-22 (×2): qty 50

## 2014-05-22 NOTE — H&P (Signed)
Attestation of Attending Supervision of Advanced Practitioner (CNM/NP): Evaluation and management procedures were performed by the Advanced Practitioner under my supervision and collaboration.  I have reviewed the Advanced Practitioner's note and chart, and I agree with the management and plan.  Anntionette Madkins 05/22/2014 7:05 AM   

## 2014-05-22 NOTE — Progress Notes (Signed)
[facility]  Patient was not taken to NICU prior to transport to Women's Unit due to patient's complaint of dizziness during transport.  Patient taken directly to room 305 and assessed.  Scant amount of bleeding and fundus one below umbilicus and firm.  Blood pressure within normal limits.  Patient no longer complaining of dizziness once in room 305.

## 2014-05-22 NOTE — Progress Notes (Signed)
Interpreter at bedside for assistance in determining pain control. Discussed how epidurals work.  Pt declining SVE.

## 2014-05-22 NOTE — Progress Notes (Signed)
Another dose of gentamycin administered explained to pt shakiness is not a side effect and may be related to fever. Interpreter at bedside to explain to pt. No adverse reaction noted

## 2014-05-22 NOTE — Progress Notes (Signed)
I assisted Educational psychologistDiana RN in [facility] with some questions about pain level . By Orlan LeavensViria Alvarez, Interpreter

## 2014-05-22 NOTE — Progress Notes (Signed)
LABOR PROGRESS NOTE  Carol [name] is a 18 y.o. G1P0 at 5755w3d  admitted for induction of labor due to PPROM.  Subjective: Very uncomfortable, feels like she needs to have a bowel movement.    Objective: BP 137/83  Pulse 113  Temp(Src) 98.4 F (36.9 C) (Axillary)  Resp 18  Ht 5\' 4"  (1.626 m)  Wt 156 lb 11.2 oz (71.079 kg)  BMI 26.88 kg/m2  SpO2 95%  LMP 09/07/2013 or  Filed Vitals:   05/22/14 1431 05/22/14 1500 05/22/14 1501 05/22/14 1531  BP: 139/95 143/84 143/84 137/83  Pulse: 124 115 115 113  Temp:      TempSrc:      Resp:    18  Height:      Weight:      SpO2:        Total I/O In: 1000 [Other:1000] Out: -   FHT:  FHR: 150 bpm, variability: moderate,  accelerations:  Present,  decelerations:  Absent UC:   Unable to trace on toco SVE:   Dilation: 2 Effacement (%): 100 Station: -2 Exam by:: Carol [name]  Dilation: 2 Effacement (%): 100 Cervical Position: Middle Station: -2 Presentation: Vertex Exam by:: Carol [name]  Pitocin 7mu  Labs: Lab Results  Component Value Date   WBC 7.5 05/21/2014   HGB 10.7* 05/21/2014   HCT 31.4* 05/21/2014   MCV 91.5 05/21/2014   PLT 147* 05/21/2014    Assessment / Plan: Induction of labor, progressing slowly.   Labor: Status post cytotec x 9, cook's foley bulb progressing very slowly, AROM this exam and IUPC placed Chorioamnionitis: amp, gent, tylenol prn Fetal Wellbeing:  Category I Pain Control:  Epidural Anticipated MOD:  Guarded  Translator present for exam after patient repeated asked same questions, she reported she understood me clearly and did not need a Nurse, learning disabilitytranslator.  Translator aided in conversation, after 20minutes of same questions patient reported she feels the Faculty practice caused the anhydramnios by giving her flagyl.  Agreed to AROM and placement of IUPC  Vaishnavi Dalby ROCIO MD 05/22/2014, 3:57 PM

## 2014-05-22 NOTE — Progress Notes (Signed)
I assisted Tiffany RN from NICU to explain visitor hours and plan of care about the baby, By Carol LeavensViria Alvarez, Interpreter

## 2014-05-23 LAB — CBC
HCT: 20.3 % — ABNORMAL LOW (ref 36.0–46.0)
Hemoglobin: 7.1 g/dL — ABNORMAL LOW (ref 12.0–15.0)
MCH: 31.6 pg (ref 26.0–34.0)
MCHC: 35 g/dL (ref 30.0–36.0)
MCV: 90.2 fL (ref 78.0–100.0)
Platelets: 124 10*3/uL — ABNORMAL LOW (ref 150–400)
RBC: 2.25 MIL/uL — ABNORMAL LOW (ref 3.87–5.11)
RDW: 13.1 % (ref 11.5–15.5)
WBC: 14.1 10*3/uL — AB (ref 4.0–10.5)

## 2014-05-23 MED ORDER — SODIUM CHLORIDE 0.9 % IV SOLN
INTRAVENOUS | Status: DC | PRN
  Administered 2014-05-23: 04:00:00 via INTRAVENOUS

## 2014-05-23 NOTE — Progress Notes (Signed)
Interpreter at bedside for shift assessment.

## 2014-05-23 NOTE — Progress Notes (Signed)
Discussed removing epidural with Dr. Malen GauzeFoster, MDA. Will plan to d/c tomorrow as long as bleeding is stable.

## 2014-05-23 NOTE — Lactation Note (Signed)
This note was copied from the chart of [facility]. Lactation Consultation Note   Initial consult with this mom of a NICU baby, now 3119 hours old, and baby is 34 4/7 weeks CGA. I assisted mom with pumping in premie setting. Mom has very wide set, small breasts, but appear WNL. I decreased her to size 21 flanges, which is still large on her right breast, pulling some of her areola tissue. Mom was able to express a drop of colostrum. Teaching done from the NICU booklet, and phone interpreter was used throughout the consult..  I also saw mom briefly in the nICU, and showed mom how to nuzzle baby while he was ng fed.Baby sleepy and not interested. I explained to mom, through interpreter, that is is normal for her size and gestation. Information for DEP faxed to [facility].  Patient Name: Boy Nohemi Angeles Berneda RoseMoran Today's Date: 05/23/2014 Reason for consult: Initial assessment   Maternal Data Formula Feeding for Exclusion: Yes (baby in NICU) Has patient been taught Hand Expression?: Yes Does the patient have breastfeeding experience prior to this delivery?: No  Feeding Feeding Type: Formula Length of feed: 15 min  LATCH Score/Interventions                      Lactation Tools Discussed/Used WIC Program: Yes [facility]) Pump Review: Setup, frequency, and cleaning;Milk Storage;Other (comment) (premie setting, hand expresion, NICU booklet in spanish, with phone line interpreter) Initiated by:: bedside rn  Date initiated:: 05/22/14   Consult Status Consult Status: Follow-up Date: 05/24/14 Follow-up type: In-patient    Alfred LevinsLee, Sharley Keeler Anne 05/23/2014, 1:55 PM

## 2014-05-23 NOTE — Anesthesia Postprocedure Evaluation (Signed)
Anesthesia Post Note  Patient: Carol [name]  Procedure(s) Performed: * No procedures listed *  Anesthesia type: Epidural  Patient location: Mother/Baby  Post pain: Pain level controlled  Post assessment: Post-op Vital signs reviewed  Last Vitals:  Filed Vitals:   05/23/14 0536  BP: 106/63  Pulse: 83  Temp: 36.5 C  Resp: 18    Post vital signs: Reviewed  Level of consciousness: awake  Complications: No apparent anesthesia complications

## 2014-05-23 NOTE — Progress Notes (Signed)
Ur chart review completed.  

## 2014-05-24 NOTE — Progress Notes (Signed)
Post Partum Day 1 seen on 10/28  Subjective: no complaints  Objective: Blood pressure 108/55, pulse 100, temperature 98.7 F (37.1 C), temperature source Oral, resp. rate 16, height 5\' 4"  (1.626 m), weight 156 lb 11.2 oz (71.079 kg), last menstrual period 09/07/2013, SpO2 100.00%, unknown if currently breastfeeding.  Physical Exam:  General: alert, cooperative and no distress Lochia: appropriate Fundus firm  Vaginal pack removed, old blood and small clots only   Recent Labs  05/21/14 1905 05/23/14 0515  HGB 10.7* 7.1*  HCT 31.4* 20.3*    Assessment/Plan: Plan for discharge tomorrow   LOS: 17 days   Jc Veron 05/24/2014, 8:28 AM

## 2014-05-24 NOTE — Progress Notes (Signed)
CSW attempted to meet with the MOB in order to role of CSW and provide emotional support due to NICU admission.  MOB had already been discharged.  CSW completed chart review and did not note any psychosocial stressors.  CSW will attempt to meet with the MOB at baby's bedside to offer support. 

## 2014-05-24 NOTE — Discharge Summary (Signed)
Obstetric Discharge Summary Reason for Admission: observation/evaluation Prenatal Procedures: ultrasound Intrapartum Procedures: spontaneous vaginal delivery Postpartum Procedures: none Complications-Operative and Postpartum: cervical laceration  Hospital Course:  Patient was admitted for observation in the setting of decreased fetal movement and recurrent decelerations on fetal monitoring at 5125w2d gestation. BPP 8/8 on admission and cervix was FT/T/H. She was found to have oligohydranmnios on repeat US. She complained of some vaginal leaking, however amnisure was negative x2. MFM was consulted and suggested that the patient be treated as PPROM and she was given course of antibiotics and betamethazone. Her course remained stable until [redacted] weeks gestation when she underwent IOL.   She progressed slowly, and received several doses of cytotec before FB was placed. She was eventually advanced to pitocin per protocol and AROM. She developed maternal and fetal tachycardia and was started on ampicillin and gentamicin for chorioamnionitis.   Delivery Note At 4:16 PM a viable female was delivered via Vaginal, Spontaneous Delivery (Presentation: LOA ). APGAR: 8, 9; weight 4 lb 9.7 oz (2090 g).  Placenta status: Intact, Spontaneous  Pathology. Cord: 3 vessels with the following complications: manual extraction secondary to vilamentous cord insertion with suspected cord avulsion although cord remained connected to membranes, cervical laceration. Unclear if patient had vaginal septum vs cervical septum, last exam by me patient was dilated to 2/100/0, suspect patient pushed through cervix vs rapid cervical change.  Anesthesia: Epidural  Episiotomy: None  Lacerations: Cervical  Suture Repair: no repair at this time, vagina was packed. Packing left in vagina  Est. Blood Loss (mL): 400  Mom to postpartum. Baby to Couplet care / Skin to Skin.  Carol [name]  05/22/2014, 8:31 PM   On PPD#1, her vaginal  packing was removed by Dr Debroah LoopArnold with passage of old blood and small clots only. There was no further intervention.  On the day of discharge,  Pt denied problems with ambulating, voiding or po intake.  She denied nausea or vomiting.  Pain wass moderately controlled.  She has had flatus. She has not had bowel movement.  Lochia Small.  Plan for birth control is undecided.    H/H: Lab Results  Component Value Date/Time   HGB 7.1* 05/23/2014  5:15 AM   HGB 11.7 11/27/2013   HCT 20.3* 05/23/2014  5:15 AM   HCT 35 11/27/2013    Filed Vitals:   05/24/14 1200  BP: 114/65  Pulse: 105  Temp: 98.2 F (36.8 C)  Resp: 16    Physical Exam: VSS NAD Abd: Appropriately tender, ND, Fundus firm No c/c/e, Neg homan's sign, neg cords Lochia Appropriate  Discharge Diagnoses: Preterm pregnancy, delivered  Discharge Information: Date: 05/24/2014 Activity: pelvic rest Diet: routine  Medications: PNV Breast feeding:  Yes Condition: stable Instructions: refer to handout Discharge to: home      Medication List    STOP taking these medications       metroNIDAZOLE 500 MG tablet  Commonly known as:  FLAGYL      TAKE these medications       prenatal multivitamin Tabs tablet  Take 1 tablet by mouth daily.       Follow-up Information   [facility] Schedule an appointment as soon as possible for a visit in 6 weeks. (for postpartum follow up)    Specialty:  Obstetrics and Gynecology   Contact information:   [address] [place] [id number] [phone]      Carol [name] 05/24/2014,3:05 PM

## 2014-05-24 NOTE — Discharge Summary (Signed)
Attestation of Attending Supervision of Resident: Evaluation and management procedures were performed by the [facility] under my supervision.  I have seen and examined the patient, reviewed the resident's note and chart, and I agree with the management and plan.  Anibal Hendersonarolyn L Harraway-Smith, M.D. 05/24/2014 5:56 PM

## 2014-05-24 NOTE — Lactation Note (Signed)
This note was copied from the chart of [name]. Lactation Consultation Note  Interpreter present for discharge teaching.  Mom is pumping every 3 hours and obtaining a few drops of colostrum.  [facility] and mom knows to call this AM to arrange a time to pick up.  Reviewed supply and demand and encouraged to let nurse know if any questions/concerns.  Patient Name: Carol [name] ZOXWR'UToday's Date: 05/24/2014     Maternal Data    Feeding Feeding Type: Formula Nipple Type: Slow - flow Length of feed: 20 min (gavage)  LATCH Score/Interventions                      Lactation Tools Discussed/Used     Consult Status      Huston FoleyMOULDEN, Marzell Allemand S 05/24/2014, 12:40 PM

## 2014-05-24 NOTE — Progress Notes (Signed)
I assisted Insurance claims handlerJill RN, with some information and questions, by Orlan LeavensViria Alvarez, Interpreter

## 2014-05-24 NOTE — Progress Notes (Signed)
Pt is discharged in the care of Father with [employer]. [employer]. Pt discharged with good understanding of home care instructions Questions asked and answered. Denies excessive vaginal bleeding.,pain ,or discomfort.Intrepetered present at discharge.

## 2014-05-24 NOTE — Discharge Instructions (Signed)

## 2014-05-27 ENCOUNTER — Ambulatory Visit: Payer: Self-pay

## 2014-05-27 NOTE — Lactation Note (Signed)
This note was copied from the chart of [facility]. Lactation Consultation Note    Follow up consult with this mom of a NICU baby, now 665 days old, and 35 1/7 weeks CGA. Mom is extremely engorged, and not expressing more than drops of milk. I got mom ice, taught her reverse massage toward her armpits, and assisted mom with hand expression and pumping with hand pump. Mom was able to express about 5 mls of milk, but her breast did soften some. Mom instructed to go home, and pump every 2 hours for 15 minutes, and ice for 20 minutes every 1-2 hours, and to lay flat in be, and massage with oil away from her nipple, to relieve swelling. Mom wants to latch baby, and I encouraged her to continue with skin to skin and latching.   Patient Name: Carol [name] Reason for consult: Follow-up assessment   Maternal Data    Feeding Feeding Type: Formula Length of feed: 30 min  LATCH Score/Interventions          Comfort (Breast/Nipple): Engorged, cracked, bleeding, large blisters, severe discomfort Problem noted: Engorgment Intervention(s): Ice;Hand expression;Reverse pressure           Lactation Tools Discussed/Used     Consult Status Consult Status: PRN Follow-up type: In-patient (NICU)    Carol [name], Alferd Obryant Anne [name], 1:51 PM

## 2014-05-29 ENCOUNTER — Encounter (HOSPITAL_COMMUNITY): Payer: Self-pay | Admitting: *Deleted

## 2014-07-04 ENCOUNTER — Telehealth: Payer: Self-pay

## 2014-07-04 ENCOUNTER — Ambulatory Visit: Payer: Medicaid Other | Admitting: Family Medicine

## 2014-07-04 ENCOUNTER — Encounter: Payer: Self-pay | Admitting: Family Medicine

## 2014-07-04 NOTE — Telephone Encounter (Signed)
Patient missed PP visit today. Called patient with interpreter Byrd HesselbachMaria. Informed patient she missed PP visit. Patient states she was unaware. Informed her we would reschedule and she will receive a call from front office staff with rescheduled appointment. Advised she call clinic if she does not hear by Friday about new appointment. Patient verbalized understanding. No questions or concerns. Message sent to admin pool to reschedule and call patient with appointment.

## 2014-08-02 ENCOUNTER — Ambulatory Visit (INDEPENDENT_AMBULATORY_CARE_PROVIDER_SITE_OTHER): Payer: Medicaid Other | Admitting: Family

## 2014-08-02 ENCOUNTER — Encounter: Payer: Self-pay | Admitting: Family

## 2014-08-02 VITALS — Temp 98.1°F | Wt 147.0 lb

## 2014-08-02 DIAGNOSIS — Q5128 Other doubling of uterus, other specified: Secondary | ICD-10-CM

## 2014-08-02 DIAGNOSIS — Q512 Other doubling of uterus, unspecified: Principal | ICD-10-CM

## 2014-08-02 NOTE — Progress Notes (Signed)
Patient ID: Carol [name], female   DOB: 25-Jun-1996, 19 y.o.   MRN: [id number] Pt desires Nexplanon but has had unprotected sex within the past 2 weeks.  Informed patient to abstain from sex for  2 wks and to make an appointment for placement of birth control.   Carol [name] present as Research officer, trade unionpanish Interpreter.

## 2014-08-02 NOTE — Progress Notes (Signed)
  Subjective:     Carol [name] is a 19 y.o. female who presents for a postpartum visit. She is 9 weeks postpartum following a spontaneous vaginal delivery. I have fully reviewed the prenatal and intrapartum course. The delivery was at 34 gestational weeks. PPROM.  Outcome: spontaneous vaginal delivery. Anesthesia: epidural. Postpartum course has been unremarkable. Baby's course has been unremarkable. Baby is feeding by bottle - Similac Advance. Bleeding staining only x 3 days.   Bowel function is normal. Bladder function is normal. Patient is sexually active. Contraception method is condoms. Unprotected intercourse one week ago.   Postpartum depression screening: negative.  Pt asymptomatic (anemia).    The following portions of the patient's history were reviewed and updated as appropriate: allergies, current medications, past family history, past medical history, past social history, past surgical history and problem list.  Review of Systems Pertinent items are noted in HPI.   Objective:    Temp(Src) 98.1 F (36.7 C)  Wt 147 lb (66.679 kg)  Breastfeeding? Yes  General:  alert, cooperative and appears stated age   Breasts:  inspection negative, no nipple discharge or bleeding, no masses or nodularity palpable  Lungs: clear to auscultation bilaterally  Heart:  regular rate and rhythm, S1, S2 normal, no murmur, click, rub or gallop  Abdomen: soft, non-tender; bowel sounds normal; no masses,  no organomegaly   Vulva:  normal  Vagina: Vaginal septum palpated, no discharge, exudate, lesion, or erythema; healed well  Cervix:  No abnormality seen  Corpus: normal size, contour, position, consistency, mobility, non-tender  Adnexa:  normal adnexa  Rectal Exam: Not performed.   Assessment:     Normal postpartum exam. Pap smear not done at today's visit.   Plan:    1. Contraception: Desires Nexplanon 2. Abstain from sex x one week, return for pregnancy test and Nexplanon  3. Follow up  in: 1 week   Marlis EdelsonWalidah N Karim, CNM

## 2014-08-27 ENCOUNTER — Ambulatory Visit (INDEPENDENT_AMBULATORY_CARE_PROVIDER_SITE_OTHER): Payer: Medicaid Other | Admitting: Family Medicine

## 2014-08-27 ENCOUNTER — Encounter: Payer: Self-pay | Admitting: Family Medicine

## 2014-08-27 ENCOUNTER — Ambulatory Visit: Payer: Medicaid Other | Admitting: Family Medicine

## 2014-08-27 VITALS — BP 112/66 | HR 98 | Temp 99.0°F | Wt 144.2 lb

## 2014-08-27 DIAGNOSIS — R102 Pelvic and perineal pain: Secondary | ICD-10-CM

## 2014-08-27 DIAGNOSIS — Z30013 Encounter for initial prescription of injectable contraceptive: Secondary | ICD-10-CM

## 2014-08-27 DIAGNOSIS — Z30430 Encounter for insertion of intrauterine contraceptive device: Secondary | ICD-10-CM

## 2014-08-27 DIAGNOSIS — Z01812 Encounter for preprocedural laboratory examination: Secondary | ICD-10-CM

## 2014-08-27 LAB — POCT PREGNANCY, URINE: PREG TEST UR: NEGATIVE

## 2014-08-27 MED ORDER — ETONOGESTREL 68 MG ~~LOC~~ IMPL
68.0000 mg | DRUG_IMPLANT | Freq: Once | SUBCUTANEOUS | Status: AC
  Administered 2014-08-27: 68 mg via SUBCUTANEOUS

## 2014-08-27 NOTE — Progress Notes (Signed)
Here for pregnancy test and nexplanon. Denies intercourse since December. Used interpreter Viviana SimplerAlis Herrera. C/o had first period January 1st until 7th. Then started spotting lat week Monday-Thursday.  States was breastfeeding and stopped completely in December. C/o bleeding dark red and c/o pelvic pain . Also c/o having a cold.

## 2014-08-27 NOTE — Progress Notes (Signed)
   Subjective:    Patient ID: Carol [name], female    DOB: 1996-02-19, 19 y.o.   MRN: [id number]  HPI Having lower abdominal pain that started 1 week ago.  No nausea, vomiting, fevers.  Started periods again.  No abnormal bleeding.  Also seen for Nexplanon insertion.   Review of Systems     Objective:   Physical Exam  Constitutional: She is oriented to person, place, and time. She appears well-developed and well-nourished.  Abdominal: Soft. Bowel sounds are normal. She exhibits no distension and no mass. There is tenderness. There is no rebound and no guarding.  Neurological: She is alert and oriented to person, place, and time.  Skin: Skin is warm and dry.  Psychiatric: She has a normal mood and affect. Her behavior is normal. Judgment and thought content normal.      Assessment & Plan:   Problem List Items Addressed This Visit    None    Visit Diagnoses    Pre-procedure lab exam    -  Primary    Encounter for initial prescription of injectable contraceptive        Relevant Medications    etonogestrel (NEXPLANON) implant 68 mg (Start on 08/27/2014  3:45 PM)    Pelvic pain in female        Relevant Orders    US Pelvis Complete    US Transvaginal Non-OB      Will get US for pelvic pain.   NExplanon inserted as described below.       Patient given informed consent, signed copy in the chart, time out was performed. Pregnancy test was neg. Appropriate time out taken.  Patient's left arm was prepped and draped in the usual sterile fashion.. The ruler used to measure and mark insertion area.  Pt was prepped with alcohol swab and then injected with 3 cc of 1% lidocaine with epinephrine.  Pt was prepped with betadine, Implanon removed form packaging,  Device confirmed in needle, then inserted full length of needle and withdrawn per handbook instructions.  Pt insertion site covered with guaze and coban.   Minimal blood loss.  Pt tolerated the procedure well.

## 2014-08-31 ENCOUNTER — Encounter: Payer: Self-pay | Admitting: *Deleted

## 2014-08-31 DIAGNOSIS — R102 Pelvic and perineal pain: Secondary | ICD-10-CM | POA: Insufficient documentation

## 2014-09-04 ENCOUNTER — Ambulatory Visit (HOSPITAL_COMMUNITY)
Admission: RE | Admit: 2014-09-04 | Discharge: 2014-09-04 | Disposition: A | Discharge status: Home / Self Care | Payer: Medicaid Other | Source: Ambulatory Visit | Attending: Family Medicine | Admitting: Family Medicine

## 2014-09-04 DIAGNOSIS — N949 Unspecified condition associated with female genital organs and menstrual cycle: Secondary | ICD-10-CM | POA: Diagnosis present

## 2014-09-04 DIAGNOSIS — Q512 Other doubling of uterus: Secondary | ICD-10-CM | POA: Diagnosis not present

## 2014-09-04 DIAGNOSIS — R102 Pelvic and perineal pain: Secondary | ICD-10-CM

## 2014-09-12 ENCOUNTER — Telehealth: Payer: Self-pay

## 2014-09-12 NOTE — Telephone Encounter (Signed)
Patient returned call. Informed her of normal U/S and need to see PCP for any further pain. Patient verbalized understanding, no questions or concerns.

## 2014-09-12 NOTE — Telephone Encounter (Signed)
-----   Message from Levie HeritageJacob J Stinson, DO sent at 09/11/2014  2:15 PM EST ----- The patient's US does not show any GYN cause of her discomfort.  If she continues to have pain, then she should she her primary doctor.  Please let the patient know.

## 2014-09-12 NOTE — Telephone Encounter (Signed)
Attempted to contact patient with interpreter Raquel Mora. No answer. Left message stating we are calling with results, please call clinic.

## 2014-10-01 ENCOUNTER — Encounter: Payer: Self-pay | Admitting: *Deleted

## 2016-06-11 ENCOUNTER — Ambulatory Visit (INDEPENDENT_AMBULATORY_CARE_PROVIDER_SITE_OTHER): Payer: Self-pay | Admitting: *Deleted

## 2016-06-11 ENCOUNTER — Encounter: Payer: Self-pay | Admitting: *Deleted

## 2016-06-11 DIAGNOSIS — Z3201 Encounter for pregnancy test, result positive: Secondary | ICD-10-CM

## 2016-06-11 DIAGNOSIS — Z3490 Encounter for supervision of normal pregnancy, unspecified, unspecified trimester: Secondary | ICD-10-CM

## 2016-06-11 LAB — POCT PREGNANCY, URINE: Preg Test, Ur: POSITIVE — AB

## 2016-06-11 NOTE — Progress Notes (Signed)
Patient presents for pregnancy test which is positive. Patient informed via interpreter of positive result. Patient would like to start her care here because her last pregnancy was high risk d/t a uterine malformation. Her baby had to be delivered prematurely. LMP unknown d/t Nexplanon removal a few months ago, then started BCP but stopped about 2 months ago and never really bled except for spotting. Scheduled an ultrasound to verify viability and dates. Patient to schedule first ob visit.

## 2016-06-23 ENCOUNTER — Ambulatory Visit (HOSPITAL_COMMUNITY): Payer: Self-pay

## 2016-06-29 ENCOUNTER — Ambulatory Visit (HOSPITAL_COMMUNITY)
Admission: RE | Admit: 2016-06-29 | Discharge: 2016-06-29 | Disposition: A | Discharge status: Home / Self Care | Payer: Medicaid Other | Source: Ambulatory Visit | Attending: Family Medicine | Admitting: Family Medicine

## 2016-06-29 DIAGNOSIS — Z3689 Encounter for other specified antenatal screening: Secondary | ICD-10-CM | POA: Diagnosis not present

## 2016-06-29 DIAGNOSIS — Z3490 Encounter for supervision of normal pregnancy, unspecified, unspecified trimester: Secondary | ICD-10-CM

## 2016-06-29 DIAGNOSIS — Z3A11 11 weeks gestation of pregnancy: Secondary | ICD-10-CM | POA: Diagnosis not present

## 2016-07-02 ENCOUNTER — Telehealth: Payer: Self-pay | Admitting: *Deleted

## 2016-07-02 ENCOUNTER — Telehealth: Payer: Self-pay | Admitting: General Practice

## 2016-07-02 NOTE — Telephone Encounter (Signed)
Patient would like the results of her u/s done on 12/4. Please return call. Patient spoke English with a heavy Spanish accent, was difficult to understand. May benefit from use of interpreter.

## 2016-07-02 NOTE — Telephone Encounter (Signed)
Called patient with pacific interpreter [phone], no answer- left detailed message per patient request including due date & gestational age and to call back with any questions

## 2016-07-13 ENCOUNTER — Encounter: Payer: Self-pay | Admitting: Obstetrics & Gynecology

## 2016-07-13 ENCOUNTER — Ambulatory Visit (INDEPENDENT_AMBULATORY_CARE_PROVIDER_SITE_OTHER): Payer: Medicaid Other | Admitting: Obstetrics & Gynecology

## 2016-07-13 ENCOUNTER — Other Ambulatory Visit (HOSPITAL_COMMUNITY)
Admission: RE | Admit: 2016-07-13 | Discharge: 2016-07-13 | Disposition: A | Discharge status: Home / Self Care | Payer: Medicaid Other | Source: Ambulatory Visit | Attending: Obstetrics & Gynecology | Admitting: Obstetrics & Gynecology

## 2016-07-13 VITALS — BP 120/74 | HR 74 | Wt 151.1 lb

## 2016-07-13 DIAGNOSIS — O3400 Maternal care for unspecified congenital malformation of uterus, unspecified trimester: Secondary | ICD-10-CM | POA: Insufficient documentation

## 2016-07-13 DIAGNOSIS — Z113 Encounter for screening for infections with a predominantly sexual mode of transmission: Secondary | ICD-10-CM | POA: Insufficient documentation

## 2016-07-13 DIAGNOSIS — O09211 Supervision of pregnancy with history of pre-term labor, first trimester: Secondary | ICD-10-CM

## 2016-07-13 DIAGNOSIS — O09219 Supervision of pregnancy with history of pre-term labor, unspecified trimester: Secondary | ICD-10-CM

## 2016-07-13 DIAGNOSIS — O3401 Maternal care for unspecified congenital malformation of uterus, first trimester: Secondary | ICD-10-CM

## 2016-07-13 DIAGNOSIS — O0991 Supervision of high risk pregnancy, unspecified, first trimester: Secondary | ICD-10-CM

## 2016-07-13 DIAGNOSIS — O0990 Supervision of high risk pregnancy, unspecified, unspecified trimester: Secondary | ICD-10-CM | POA: Insufficient documentation

## 2016-07-13 DIAGNOSIS — O219 Vomiting of pregnancy, unspecified: Secondary | ICD-10-CM

## 2016-07-13 DIAGNOSIS — O09899 Supervision of other high risk pregnancies, unspecified trimester: Secondary | ICD-10-CM | POA: Insufficient documentation

## 2016-07-13 LAB — POCT URINALYSIS DIP (DEVICE)
Bilirubin Urine: NEGATIVE
Glucose, UA: NEGATIVE mg/dL
Ketones, ur: NEGATIVE mg/dL
NITRITE: NEGATIVE
PROTEIN: NEGATIVE mg/dL
Specific Gravity, Urine: >=1.03 (ref 1.005–1.030)
UROBILINOGEN UA: 0.2 mg/dL (ref 0.0–1.0)
pH: 6 (ref 5.0–8.0)

## 2016-07-13 MED ORDER — DOXYLAMINE-PYRIDOXINE 10-10 MG PO TBEC: 2.0000 | DELAYED_RELEASE_TABLET | Freq: Every day | ORAL | 5 refills | Status: DC

## 2016-07-13 MED ORDER — PRENATAL VITAMINS 0.8 MG PO TABS: 1.0000 | ORAL_TABLET | Freq: Every day | ORAL | 12 refills | Status: DC

## 2016-07-13 NOTE — Progress Notes (Signed)
Subjective:   Carol [name] is a 20 y.o. G2P0101 at 4948w6d by early ultrasound being seen today for her first obstetrical visit. Patient is Spanish-speaking only, Spanish interpreter present for this encounter. Her obstetrical history is significant for oligohydramnios at 32 weeks in previous pregnancy; was treated as PPROM (had negative Amnisure x 2) and induced at 34 weeks.  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  Patient does intend to breast feed. Pregnancy history fully reviewed.  Patient reports nausea and vomiting, wants medication.  HISTORY: Obstetric History   G2   P1   T0   P1   A0   L1    SAB0   TAB0   Ectopic0   Multiple0   Live Births1     # Outcome Date GA Lbr Len/2nd Weight Sex Delivery Anes PTL Lv  2 Current           1 Preterm 05/22/14 7667w3d 05:31 / 00:15 4 lb 9.7 oz (2.09 kg) M Vag-Spont EPI Y LIV     Name: ANGELES MORAN,BOY Kylia     Apgar1:  8                Apgar5: 9     Past Medical History:  Diagnosis Date  . Medical history non-contributory   . Preterm labor   . 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 NO PAST SURGERIES     Family History  Problem Relation Age of Onset  . Diabetes Mother   . Diabetes Father    Social History  Substance Use Topics  . Smoking status: Never Smoker  . Smokeless tobacco: Never Used  . Alcohol use No   No Known Allergies Current Outpatient Prescriptions on File Prior to Visit  Medication Sig Dispense Refill  . Prenatal Vit-Fe Fumarate-FA (PRENATAL MULTIVITAMIN) TABS tablet Take 1 tablet by mouth daily.    Marland Kitchen. acetaminophen (TYLENOL) 325 MG tablet Take 650 mg by mouth every 6 (six) hours as needed.     Current Facility-Administered Medications on File Prior to Visit  Medication Dose Route Frequency Provider Last Rate Last Dose  . Tdap (BOOSTRIX) injection 0.5 mL  0.5 mL Intramuscular Once Walidah N Karim, CNM         Exam    Vitals:   07/13/16 0956  BP: 120/74  Pulse: 74  Weight: 151 lb 1.6 oz (68.5 kg)   Fetal Heart Rate (bpm): 154  Uterus:     Pelvic Exam: Deferred   System: General: well-developed, well-nourished female in no acute distress   Breast:  normal appearance, no masses or tenderness   Skin: normal coloration and turgor, no rashes   Neurologic: oriented, normal, negative, normal mood   Extremities: normal strength, tone, and muscle mass, ROM of all joints is normal   HEENT PERRLA, extraocular movement intact and sclera clear, anicteric   Mouth/Teeth mucous membranes moist, pharynx normal without lesions and dental hygiene good   Neck supple and no masses   Cardiovascular: regular rate and rhythm   Respiratory:  no respiratory distress, normal breath sounds   Abdomen: soft, non-tender; bowel sounds normal; no masses,  no organomegaly     Assessment:   Pregnancy: G2P0101 Patient Active Problem List   Diagnosis Date Noted  . Supervision of high-risk pregnancy 07/13/2016  . History of preterm delivery, currently pregnant 07/13/2016  . 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  Pelvic pain in female 08/31/2014  . 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 Language barrier 03/02/2014     Plan:  1. History of preterm delivery, currently pregnant Not a candidate for 17P given no spontaneous PTB. Will continue to monitor closely.  2. 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 Nausea and vomiting of pregnancy, antepartum - Doxylamine-Pyridoxine (DICLEGIS) 10-10 MG TBEC; Take 2 tablets by mouth at bedtime. If symptoms persist, add one tablet in the morning and one in the afternoon  Dispense: 100 tablet; Refill: 5  4. Supervision of high risk pregnancy in first trimester - Prenatal Multivit-Min-Fe-FA (PRENATAL VITAMINS) 0.8 MG tablet; Take 1 tablet by mouth daily.  Dispense: 30 tablet;  Refill: 12 - Prenatal Profile - Hemoglobinopathy Evaluation - Cystic fibrosis diagnostic study - Culture, OB Urine - Pain Mgmt, Profile 6 Conf w/o mM, U - US MFM OB DETAIL +14 WK; Future - US Renal; Future - GC[facility]Chlamydia probe amp ([place]  Initial labs drawn. Continue prenatal vitamins. Genetic Screening discussed, Quad screen: to be ordered next visit. Ultrasound discussed; fetal anatomic survey: to be ordered next visit. Problem list reviewed and updated. The nature of  [facility] with multiple MDs and other Advanced Practice Providers was explained to patient; also emphasized that residents, students are part of our team. Routine obstetric precautions reviewed. Return in about 4 weeks (around 08/10/2016) for OB Visit.     Jaynie CollinsUGONNA  Jameeka Marcy, MD, FACOG Attending Obstetrician & Gynecologist, [facility] for [facility], [facility]

## 2016-07-13 NOTE — Patient Instructions (Signed)

## 2016-07-13 NOTE — Progress Notes (Signed)
Initial prenatal labs, flu vaccine today Spanish interpreter PatmosBlanca used for visit

## 2016-07-14 LAB — PRENATAL PROFILE (SOLSTAS)
ANTIBODY SCREEN: NEGATIVE
Basophils Absolute: 0 cells/uL (ref 0–200)
Basophils Relative: 0 %
EOS ABS: 72 {cells}/uL (ref 15–500)
Eosinophils Relative: 1 %
HEMATOCRIT: 40.4 % (ref 35.0–45.0)
HIV: NONREACTIVE
Hemoglobin: 13.5 g/dL (ref 11.7–15.5)
Hepatitis B Surface Ag: NEGATIVE
LYMPHS PCT: 24 %
Lymphs Abs: 1728 cells/uL (ref 850–3900)
MCH: 31.6 pg (ref 27.0–33.0)
MCHC: 33.4 g/dL (ref 32.0–36.0)
MCV: 94.6 fL (ref 80.0–100.0)
MONO ABS: 360 {cells}/uL (ref 200–950)
MONOS PCT: 5 %
MPV: 10.8 fL (ref 7.5–12.5)
Neutro Abs: 5040 cells/uL (ref 1500–7800)
Neutrophils Relative %: 70 %
Platelets: 215 10*3/uL (ref 140–400)
RBC: 4.27 MIL/uL (ref 3.80–5.10)
RDW: 12.7 % (ref 11.0–15.0)
RH TYPE: POSITIVE
Rubella: 6.44 Index — ABNORMAL HIGH (ref <0.90)
WBC: 7.2 10*3/uL (ref 3.8–10.8)

## 2016-07-14 LAB — GC/CHLAMYDIA PROBE AMP (~~LOC~~) NOT AT ARMC
Chlamydia: NEGATIVE
NEISSERIA GONORRHEA: NEGATIVE

## 2016-07-15 LAB — HEMOGLOBINOPATHY EVALUATION
HCT: 40.4 % (ref 35.0–45.0)
HEMOGLOBIN: 13.5 g/dL (ref 11.7–15.5)
Hgb A2 Quant: 2.4 % (ref 1.8–3.5)
Hgb A: 96.6 % (ref >96.0)
MCH: 31.6 pg (ref 27.0–33.0)
MCV: 94.6 fL (ref 80.0–100.0)
RBC: 4.27 MIL/uL (ref 3.80–5.10)
RDW: 12.7 % (ref 11.0–15.0)

## 2016-07-15 LAB — CYSTIC FIBROSIS DIAGNOSTIC STUDY

## 2016-07-22 ENCOUNTER — Telehealth: Payer: Self-pay | Admitting: *Deleted

## 2016-07-22 NOTE — Telephone Encounter (Signed)
Message left yesterday by someone other than pt.  She stated that they had left message on nurse line previously and someone called back but they missed the call. Please call back to [phone].  *Note - this is not pt's listed telephone number.

## 2016-07-31 NOTE — Telephone Encounter (Signed)
Attempted to call patient on number listed. No answer or voicemail to leave a message. Patient does have appointment on 08/13/2016

## 2016-08-13 ENCOUNTER — Encounter: Payer: Medicaid Other | Admitting: Obstetrics and Gynecology

## 2016-08-20 ENCOUNTER — Ambulatory Visit (INDEPENDENT_AMBULATORY_CARE_PROVIDER_SITE_OTHER): Payer: Medicaid Other | Admitting: Obstetrics & Gynecology

## 2016-08-20 ENCOUNTER — Other Ambulatory Visit (HOSPITAL_COMMUNITY)
Admission: RE | Admit: 2016-08-20 | Discharge: 2016-08-20 | Disposition: A | Discharge status: Home / Self Care | Payer: Medicaid Other | Source: Ambulatory Visit | Attending: Obstetrics & Gynecology | Admitting: Obstetrics & Gynecology

## 2016-08-20 VITALS — BP 124/61 | HR 87 | Wt 158.9 lb

## 2016-08-20 DIAGNOSIS — O26892 Other specified pregnancy related conditions, second trimester: Secondary | ICD-10-CM | POA: Insufficient documentation

## 2016-08-20 DIAGNOSIS — N898 Other specified noninflammatory disorders of vagina: Secondary | ICD-10-CM | POA: Insufficient documentation

## 2016-08-20 DIAGNOSIS — Z113 Encounter for screening for infections with a predominantly sexual mode of transmission: Secondary | ICD-10-CM | POA: Diagnosis present

## 2016-08-20 DIAGNOSIS — Z3A18 18 weeks gestation of pregnancy: Secondary | ICD-10-CM | POA: Insufficient documentation

## 2016-08-20 DIAGNOSIS — O0992 Supervision of high risk pregnancy, unspecified, second trimester: Secondary | ICD-10-CM

## 2016-08-20 NOTE — Progress Notes (Signed)
Discharge goes from white to yellow or green with irritation Spanish interpreter "Raquel" used for visit

## 2016-08-21 LAB — 2HR GTT W 1 HR, CARPENTER, 75 G
GLUCOSE, 1 HR, GEST: 149 mg/dL (ref <180)
GLUCOSE, 2 HR, GEST: 132 mg/dL (ref <153)
GLUCOSE, FASTING, GEST: 76 mg/dL (ref 65–91)

## 2016-08-21 LAB — CERVICOVAGINAL ANCILLARY ONLY
BACTERIAL VAGINITIS: NEGATIVE
Candida vaginitis: NEGATIVE
Trichomonas: NEGATIVE

## 2016-08-25 ENCOUNTER — Ambulatory Visit (HOSPITAL_COMMUNITY)
Admission: RE | Admit: 2016-08-25 | Discharge: 2016-08-25 | Disposition: A | Discharge status: Home / Self Care | Payer: Medicaid Other | Source: Ambulatory Visit | Attending: Obstetrics & Gynecology | Admitting: Obstetrics & Gynecology

## 2016-08-25 ENCOUNTER — Encounter (HOSPITAL_COMMUNITY): Payer: Self-pay

## 2016-08-25 ENCOUNTER — Other Ambulatory Visit: Payer: Self-pay | Admitting: Obstetrics & Gynecology

## 2016-08-25 ENCOUNTER — Other Ambulatory Visit (HOSPITAL_COMMUNITY): Payer: Self-pay | Admitting: *Deleted

## 2016-08-25 DIAGNOSIS — O09899 Supervision of other high risk pregnancies, unspecified trimester: Secondary | ICD-10-CM

## 2016-08-25 DIAGNOSIS — Z3A20 20 weeks gestation of pregnancy: Secondary | ICD-10-CM

## 2016-08-25 DIAGNOSIS — O3401 Maternal care for unspecified congenital malformation of uterus, first trimester: Secondary | ICD-10-CM

## 2016-08-25 DIAGNOSIS — Q5128 Other doubling of uterus, other specified: Secondary | ICD-10-CM

## 2016-08-25 DIAGNOSIS — O0991 Supervision of high risk pregnancy, unspecified, first trimester: Secondary | ICD-10-CM

## 2016-08-25 DIAGNOSIS — O34592 Maternal care for other abnormalities of gravid uterus, second trimester: Secondary | ICD-10-CM

## 2016-08-25 DIAGNOSIS — Z363 Encounter for antenatal screening for malformations: Secondary | ICD-10-CM | POA: Diagnosis present

## 2016-08-25 DIAGNOSIS — Q512 Other doubling of uterus: Secondary | ICD-10-CM | POA: Diagnosis not present

## 2016-08-25 DIAGNOSIS — Q519 Congenital malformation of uterus and cervix, unspecified: Secondary | ICD-10-CM

## 2016-08-25 DIAGNOSIS — O09219 Supervision of pregnancy with history of pre-term labor, unspecified trimester: Secondary | ICD-10-CM

## 2016-08-27 ENCOUNTER — Encounter: Payer: Self-pay | Admitting: Obstetrics & Gynecology

## 2016-08-27 DIAGNOSIS — Q600 Renal agenesis, unilateral: Secondary | ICD-10-CM

## 2016-08-27 HISTORY — DX: Renal agenesis, unilateral: Q60.0

## 2016-09-17 ENCOUNTER — Ambulatory Visit (INDEPENDENT_AMBULATORY_CARE_PROVIDER_SITE_OTHER): Payer: Medicaid Other | Admitting: Obstetrics & Gynecology

## 2016-09-17 VITALS — BP 110/70 | HR 85 | Wt 161.3 lb

## 2016-09-17 DIAGNOSIS — O0990 Supervision of high risk pregnancy, unspecified, unspecified trimester: Secondary | ICD-10-CM

## 2016-09-17 DIAGNOSIS — O0992 Supervision of high risk pregnancy, unspecified, second trimester: Secondary | ICD-10-CM

## 2016-09-17 LAB — POCT URINALYSIS DIP (DEVICE)
BILIRUBIN URINE: NEGATIVE
Glucose, UA: NEGATIVE mg/dL
Ketones, ur: NEGATIVE mg/dL
LEUKOCYTES UA: NEGATIVE
Nitrite: NEGATIVE
Protein, ur: NEGATIVE mg/dL
SPECIFIC GRAVITY, URINE: 1.01 (ref 1.005–1.030)
Urobilinogen, UA: 0.2 mg/dL (ref 0.0–1.0)
pH: 7 (ref 5.0–8.0)

## 2016-09-17 NOTE — Patient Instructions (Signed)

## 2016-09-17 NOTE — Progress Notes (Signed)
   PRENATAL VISIT NOTE  Subjective:  Carol [name] is a 21 y.o. G2P0101 at 3645w2d being seen today for ongoing prenatal care.  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  Patient reports no complaints.  Contractions: Not present. Vag. Bleeding: None.  Movement: Present. Denies leaking of fluid.   The following portions of the patient's history were reviewed and updated as appropriate: allergies, current medications, past family history, past medical history, past social history, past surgical history and problem list. Problem list updated.  Objective:   Vitals:   09/17/16 1358  BP: 110/70  Pulse: 85  Weight: 161 lb 4.8 oz (73.2 kg)    Fetal Status: Fetal Heart Rate (bpm): 140 Fundal Height: 24 cm Movement: Present     General:  Alert, oriented and cooperative. Patient is in no acute distress.  Skin: Skin is warm and dry. No rash noted.   Cardiovascular: Normal heart rate noted  Respiratory: Normal respiratory effort, no problems with respiration noted  Abdomen: Soft, gravid, appropriate for gestational age. Pain/Pressure: Absent     Pelvic:  Cervical exam deferred        Extremities: Normal range of motion.  Edema: None  Mental Status: Normal mood and affect. Normal behavior. Normal judgment and thought content.   Assessment and Plan:  Pregnancy: G2P0101 at 5545w2d  1. Supervision of high risk pregnancy, antepartum Nl US 20 weeks, f/u 3/13 - AFP, Quad Screen  Preterm labor symptoms and general obstetric precautions including but not limited to vaginal bleeding, contractions, leaking of fluid and fetal movement were reviewed in detail with the patient. Please refer to After Visit  Summary for other counseling recommendations.  Return in about 4 weeks (around 10/15/2016).   Adam PhenixJames G Arnold, MD

## 2016-10-06 ENCOUNTER — Other Ambulatory Visit (HOSPITAL_COMMUNITY): Payer: Self-pay | Admitting: *Deleted

## 2016-10-06 ENCOUNTER — Ambulatory Visit (HOSPITAL_COMMUNITY)
Admission: RE | Admit: 2016-10-06 | Discharge: 2016-10-06 | Disposition: A | Discharge status: Home / Self Care | Payer: Medicaid Other | Source: Ambulatory Visit | Attending: Obstetrics & Gynecology | Admitting: Obstetrics & Gynecology

## 2016-10-06 ENCOUNTER — Other Ambulatory Visit (HOSPITAL_COMMUNITY): Payer: Self-pay | Admitting: Maternal and Fetal Medicine

## 2016-10-06 ENCOUNTER — Encounter (HOSPITAL_COMMUNITY): Payer: Self-pay

## 2016-10-06 DIAGNOSIS — Z87798 Personal history of other (corrected) congenital malformations: Secondary | ICD-10-CM

## 2016-10-06 DIAGNOSIS — O09212 Supervision of pregnancy with history of pre-term labor, second trimester: Secondary | ICD-10-CM

## 2016-10-06 DIAGNOSIS — O34599 Maternal care for other abnormalities of gravid uterus, unspecified trimester: Secondary | ICD-10-CM

## 2016-10-06 DIAGNOSIS — O09899 Supervision of other high risk pregnancies, unspecified trimester: Secondary | ICD-10-CM

## 2016-10-06 DIAGNOSIS — O3401 Maternal care for unspecified congenital malformation of uterus, first trimester: Secondary | ICD-10-CM

## 2016-10-06 DIAGNOSIS — O34592 Maternal care for other abnormalities of gravid uterus, second trimester: Secondary | ICD-10-CM | POA: Insufficient documentation

## 2016-10-06 DIAGNOSIS — Q600 Renal agenesis, unilateral: Secondary | ICD-10-CM

## 2016-10-06 DIAGNOSIS — Q5128 Other doubling of uterus, other specified: Principal | ICD-10-CM

## 2016-10-06 DIAGNOSIS — O09892 Supervision of other high risk pregnancies, second trimester: Secondary | ICD-10-CM

## 2016-10-06 DIAGNOSIS — O09219 Supervision of pregnancy with history of pre-term labor, unspecified trimester: Secondary | ICD-10-CM

## 2016-10-06 DIAGNOSIS — Z3A26 26 weeks gestation of pregnancy: Secondary | ICD-10-CM

## 2016-10-06 DIAGNOSIS — Q519 Congenital malformation of uterus and cervix, unspecified: Secondary | ICD-10-CM

## 2016-10-06 DIAGNOSIS — Z362 Encounter for other antenatal screening follow-up: Secondary | ICD-10-CM

## 2016-10-15 ENCOUNTER — Encounter (HOSPITAL_COMMUNITY): Payer: Self-pay

## 2016-10-15 ENCOUNTER — Ambulatory Visit (INDEPENDENT_AMBULATORY_CARE_PROVIDER_SITE_OTHER): Payer: Medicaid Other | Admitting: Obstetrics and Gynecology

## 2016-10-15 ENCOUNTER — Inpatient Hospital Stay (HOSPITAL_COMMUNITY)
Admission: AD | Admit: 2016-10-15 | Discharge: 2016-10-15 | Disposition: A | Discharge status: Home / Self Care | Payer: Medicaid Other | Source: Ambulatory Visit | Attending: Obstetrics & Gynecology | Admitting: Obstetrics & Gynecology

## 2016-10-15 VITALS — BP 109/73 | HR 95 | Wt 170.9 lb

## 2016-10-15 DIAGNOSIS — O479 False labor, unspecified: Secondary | ICD-10-CM

## 2016-10-15 DIAGNOSIS — Q512 Other doubling of uterus, unspecified: Secondary | ICD-10-CM

## 2016-10-15 DIAGNOSIS — Z833 Family history of diabetes mellitus: Secondary | ICD-10-CM | POA: Diagnosis not present

## 2016-10-15 DIAGNOSIS — O09219 Supervision of pregnancy with history of pre-term labor, unspecified trimester: Secondary | ICD-10-CM

## 2016-10-15 DIAGNOSIS — O26892 Other specified pregnancy related conditions, second trimester: Secondary | ICD-10-CM | POA: Insufficient documentation

## 2016-10-15 DIAGNOSIS — Z23 Encounter for immunization: Secondary | ICD-10-CM

## 2016-10-15 DIAGNOSIS — Z789 Other specified health status: Secondary | ICD-10-CM | POA: Diagnosis not present

## 2016-10-15 DIAGNOSIS — Z3A27 27 weeks gestation of pregnancy: Secondary | ICD-10-CM | POA: Insufficient documentation

## 2016-10-15 DIAGNOSIS — O09212 Supervision of pregnancy with history of pre-term labor, second trimester: Secondary | ICD-10-CM

## 2016-10-15 DIAGNOSIS — O0990 Supervision of high risk pregnancy, unspecified, unspecified trimester: Secondary | ICD-10-CM

## 2016-10-15 DIAGNOSIS — O09899 Supervision of other high risk pregnancies, unspecified trimester: Secondary | ICD-10-CM

## 2016-10-15 DIAGNOSIS — O4702 False labor before 37 completed weeks of gestation, second trimester: Secondary | ICD-10-CM | POA: Diagnosis not present

## 2016-10-15 DIAGNOSIS — Q5128 Other doubling of uterus, other specified: Secondary | ICD-10-CM

## 2016-10-15 DIAGNOSIS — Q600 Renal agenesis, unilateral: Secondary | ICD-10-CM | POA: Insufficient documentation

## 2016-10-15 DIAGNOSIS — R109 Unspecified abdominal pain: Secondary | ICD-10-CM | POA: Diagnosis present

## 2016-10-15 DIAGNOSIS — O34592 Maternal care for other abnormalities of gravid uterus, second trimester: Secondary | ICD-10-CM | POA: Diagnosis not present

## 2016-10-15 DIAGNOSIS — O3402 Maternal care for unspecified congenital malformation of uterus, second trimester: Secondary | ICD-10-CM | POA: Diagnosis not present

## 2016-10-15 DIAGNOSIS — O3401 Maternal care for unspecified congenital malformation of uterus, first trimester: Secondary | ICD-10-CM

## 2016-10-15 LAB — AFP, QUAD SCREEN
DIA VALUE (EIA): 208.07 pg/mL
DSR (By Age)    1 IN: 1097
Gestational Age: 23.3 WEEKS
MSAFP MOM: 1.07
MSAFP: 83.2 ng/mL
MSHCG: 20371 m[IU]/mL
Maternal Age At EDD: 23.2 YEARS
Osb Risk: 9862
UE3 VALUE: 1.92 ng/mL
Weight: 161 [lb_av]

## 2016-10-15 LAB — URINALYSIS, ROUTINE W REFLEX MICROSCOPIC
BILIRUBIN URINE: NEGATIVE
Glucose, UA: 50 mg/dL — AB
Hgb urine dipstick: NEGATIVE
KETONES UR: NEGATIVE mg/dL
Leukocytes, UA: NEGATIVE
NITRITE: NEGATIVE
Protein, ur: NEGATIVE mg/dL
Specific Gravity, Urine: 1.012 (ref 1.005–1.030)
pH: 8 (ref 5.0–8.0)

## 2016-10-15 MED ORDER — TETANUS-DIPHTH-ACELL PERTUSSIS 5-2.5-18.5 LF-MCG/0.5 IM SUSP
0.5000 mL | Freq: Once | INTRAMUSCULAR | Status: AC
  Administered 2016-10-15: 0.5 mL via INTRAMUSCULAR

## 2016-10-15 NOTE — MAU Provider Note (Signed)
Patient Carol [name] is a 21 year old G2P0101 at 27 weeks and 2 days here after being seen in the [facility] for her prenatal visit. She complained of some tightening and was sent to MAU for evaluation. Her cervix was found to be 1-2 cm/50 cm at her prenatal visit today.   Her history is significant for suspected PPROM at 32 weeks, oligo, and IOL at 34 weeks in her past pregnancy (2016).  History     CSN: [id number]  Arrival date and time: 10/15/16 1024   First Provider Initiated Contact with Patient 10/15/16 1110      No chief complaint on file.  Abdominal Pain  This is a new problem. The current episode started today. The onset quality is gradual. The problem occurs intermittently. The problem has been unchanged. The pain is located in the generalized abdominal region. The pain is at a severity of 3/10. The quality of the pain is cramping. The abdominal pain does not radiate. Pertinent negatives include no anorexia, arthralgias, belching, constipation, diarrhea or dysuria. The pain is aggravated by movement and eating. The pain is relieved by being still.  Patient states that she always gets occasional tightening in her abdomen when she stands for a long time at work or when she drinks a lot of water. When this happens so goes back out to her car to rest and she feels better.   OB History    Gravida Para Term Preterm AB Living   2 1   1   1    SAB TAB Ectopic Multiple Live Births           1      Past Medical History:  Diagnosis Date  . 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 NO PAST SURGERIES      Family History  Problem Relation Age of Onset  . Diabetes Mother   . Diabetes Father     Social History  Substance Use Topics  . Smoking status: Never Smoker  . Smokeless tobacco: Never Used   . Alcohol use No    Allergies: No Known Allergies  Prescriptions Prior to Admission  Medication Sig Dispense Refill Last Dose  . Prenatal Multivit-Min-Fe-FA (PRENATAL VITAMINS) 0.8 MG tablet Take 1 tablet by mouth daily. 30 tablet 12 10/15/2016 at Unknown time    Review of Systems  Gastrointestinal: Positive for abdominal pain. Negative for anorexia, constipation and diarrhea.  Genitourinary: Negative for dysuria.  Musculoskeletal: Negative for arthralgias.   Physical Exam   Blood pressure 133/74, pulse 100, temperature 99 F (37.2 C), resp. rate 18, SpO2 100 %.  Physical Exam  Constitutional: She is oriented to person, place, and time. She appears well-developed.  HENT:  Head: Normocephalic.  Eyes: Pupils are equal, round, and reactive to light.  Neck: Normal range of motion.  Cardiovascular: Normal rate.   Respiratory: Effort normal.  GI: Soft.  Genitourinary:  Genitourinary Comments: NEFG; Cervix is 1-2, soft, posterior  Musculoskeletal: Normal range of motion.  Neurological: She is alert and oriented to person, place, and time.  Skin: Skin is warm and dry.     MAU Course  Procedures  MDM -PO hydration and snack -NST: 145 bpm, mod var, pres acel, neg decl, no contrations -Cervical exam unchanged after 2 hours   Assessment and Plan  1. Braxton Hick's contraction   2. Congenital renal agenesis, unilateral   3. History of preterm delivery, currently pregnant   4. Congenital abnormality of uterus during pregnancy in first trimester    2. Patient stable for discharge; reviewed with patient when to return to the MAU (bleeding, leaking of fluid, decreased fetal movements) 3. Patient will keep her renal ultrasound appointment on 3-26, as well as her prenatal appoints and US on 4-5 and 4-10.    Charlesetta GaribaldiKathryn Lorraine Rekita Miotke CNM 10/15/2016, 11:19 AM

## 2016-10-15 NOTE — Progress Notes (Signed)
Used interpreter [employer] Mora [employer]. Patient complains of feeling baby is very low and having lower back pain

## 2016-10-15 NOTE — MAU Note (Signed)
Pt presents to MAU after her appointment today for monitoring.Carol [name].Carol [name].Denies any vaginal bleeding or abnormal discharge. Pt was 1-2/50 in the office today.

## 2016-10-15 NOTE — Progress Notes (Signed)
Subjective:  Carol [name] is a 21 y.o. G2P0101 at 5233w2d being seen today for ongoing prenatal care.  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  Patient reports uterine tighting for last several weeks, particularly when standing on her feet, some vaginal pressure at times, denies any vaginal bleeding or LOF. Last IC was 2 days ago. .  Contractions: Not present. Vag. Bleeding: None.  Movement: Present. Denies leaking of fluid.   The following portions of the patient's history were reviewed and updated as appropriate: allergies, current medications, past family history, past medical history, past social history, past surgical history and problem list. Problem list updated.  Objective:   Vitals:   10/15/16 0816  BP: 109/73  Pulse: 95    Fetal Status: Fetal Heart Rate (bpm): 137   Movement: Present     General:  Alert, oriented and cooperative. Patient is in no acute distress.  Skin: Skin is warm and dry. No rash noted.   Cardiovascular: Normal heart rate noted  Respiratory: Normal respiratory effort, no problems with respiration noted  Abdomen: Soft, gravid, appropriate for gestational age. Pain/Pressure: Absent     Pelvic:  Cervical exam performed        Extremities: Normal range of motion.  Edema: None  Mental Status: Normal mood and affect. Normal behavior. Normal judgment and thought content.   Urinalysis:      Assessment and Plan:  Pregnancy: G2P0101 at 5433w2d  1. Supervision of high risk pregnancy, antepartum Concerned about some preterm uterine contractions. Will complete her 28 week labs and then send to MAU for further eval. Discussed with Dr Macon LargeAnyanwu - Glucose tolerance, 2 hours - RPR - HIV  antibody (with reflex) - CBC  2. History of preterm delivery, currently pregnant H/O unexplainable oligo with last pregnancy, treated as PROM and IOL at 34 weeks  3. Uterus didelphys Exact type anomaly unknown. Noted on GYN U/S in 2016 but never had MRI follow up. Maternal renal U/S ordered  4. Language barrier Interrupter services used today  Preterm labor symptoms and general obstetric precautions including but not limited to vaginal bleeding, contractions, leaking of fluid and fetal movement were reviewed in detail with the patient. Please refer to After Visit Summary for other counseling recommendations.  Return in about 2 weeks (around 10/29/2016).   Hermina StaggersMichael L Shakema Surita, MD

## 2016-10-15 NOTE — Discharge Instructions (Signed)
Ruptura prematura de membranas y ruptura prematura de membranas antes de término °(Premature Rupture and Preterm Premature Rupture of Membranes) °El bebé dentro de la matriz (útero) se encuentra rodeado de una bolsa formada por membranas. Cuando esta bolsa se rompe antes de que comiencen las contracciones o el trabajo de parto, se considera una ruptura prematura de las membranas (RPM). La ruptura de las membranas también se conoce como ruptura de la bolsa de aguas. Si esto ocurre antes de la semana 37, se denomina ruptura prematura de las membranas pretérmino (RPMP). La RPMP es grave. Es necesario que reciba atención médica de inmediato. °CAUSAS °La causa de la RPM es el debilitamiento de las membranas. Esto ocurre hacia el final del embarazo. Generalmente se debe a una infección, pero puede haber otras causas. °SIGNOS DE RPM O RPMP °· Perdida repentina de líquido por la vagina. °· Perdida lenta de líquido por la vagina. °· Su ropa interior se moja. ° °QUÉ DEBE HACER SI PIENSA QUE HA ROTO LA BOLSA DE AGUAS °Comuníquese con su médico de inmediato. Debe concurrir al hospital para ser controlada inmediatamente. °¿QUÉ OCURRE SI LE DICEN QUE TIENE UNA RPM O RPMP? °Una vez que llegue al hospital le harán algunos estudios. Si tiene una RPM, podrá ser necesario que le administren medicamentos para comenzar el trabajo de parto (inducción). Esto puede ocurrir si no tiene contracciones dentro de las 24 horas de que haya roto la bolsa de aguas. Si tiene una RPMP y no tiene contracciones, le administrarán un medicamento para iniciar el trabajo de parto. Esto dependerá de la etapa en que se encuentre del embarazo. °Si tiene una RPMP, usted: °· Y su bebé serán observados estrechamente para buscar signos de infección u otros problemas. °· Le recetarán antibióticos. Esto puede impedir que aparezca una infección. °· Es posible que le indiquen corticoides. Esto hace que los pulmones se desarrollen más rápidamente. °· Es posible que le  administren medicamentos para detener el trabajo de parto (trabajo de parto pretérmino). °· Le indicarán que permanezca en la cama excepto para ir al baño (reposo en cama). °· Es posible que le administren medicamentos para comenzar el trabajo de parto. Esto ocurre si hay problemas con usted o el bebé. °El tratamiento dependerá de muchos factores. °Esta información no tiene como fin reemplazar el consejo del médico. Asegúrese de hacerle al médico cualquier pregunta que tenga. °Document Released: 03/15/2013 Document Revised: 03/15/2013 Document Reviewed: 11/01/2012 °Elsevier Interactive Patient Education © 2017 [employer] ° °

## 2016-10-16 LAB — GLUCOSE TOLERANCE, 2 HOURS
Glucose, 2 hour: 146 mg/dL — ABNORMAL HIGH (ref 65–139)
Glucose, GTT - Fasting: 76 mg/dL (ref 65–99)

## 2016-10-16 LAB — CBC
HEMATOCRIT: 37.4 % (ref 34.0–46.6)
HEMOGLOBIN: 12 g/dL (ref 11.1–15.9)
MCH: 30.5 pg (ref 26.6–33.0)
MCHC: 32.1 g/dL (ref 31.5–35.7)
MCV: 95 fL (ref 79–97)
Platelets: 232 10*3/uL (ref 150–379)
RBC: 3.93 x10E6/uL (ref 3.77–5.28)
RDW: 13.2 % (ref 12.3–15.4)
WBC: 8.8 10*3/uL (ref 3.4–10.8)

## 2016-10-16 LAB — RPR: RPR Ser Ql: NONREACTIVE

## 2016-10-16 LAB — HIV ANTIBODY (ROUTINE TESTING W REFLEX): HIV SCREEN 4TH GENERATION: NONREACTIVE

## 2016-10-19 ENCOUNTER — Ambulatory Visit (HOSPITAL_COMMUNITY)
Admission: RE | Admit: 2016-10-19 | Discharge: 2016-10-19 | Disposition: A | Discharge status: Home / Self Care | Payer: Medicaid Other | Source: Ambulatory Visit | Attending: Obstetrics & Gynecology | Admitting: Obstetrics & Gynecology

## 2016-10-19 DIAGNOSIS — Q512 Other doubling of uterus: Secondary | ICD-10-CM | POA: Insufficient documentation

## 2016-10-19 DIAGNOSIS — O3403 Maternal care for unspecified congenital malformation of uterus, third trimester: Secondary | ICD-10-CM | POA: Diagnosis present

## 2016-10-19 DIAGNOSIS — Z3A28 28 weeks gestation of pregnancy: Secondary | ICD-10-CM | POA: Diagnosis not present

## 2016-10-19 DIAGNOSIS — O0991 Supervision of high risk pregnancy, unspecified, first trimester: Secondary | ICD-10-CM

## 2016-10-29 ENCOUNTER — Ambulatory Visit (INDEPENDENT_AMBULATORY_CARE_PROVIDER_SITE_OTHER): Payer: Medicaid Other | Admitting: Obstetrics and Gynecology

## 2016-10-29 VITALS — BP 113/52 | HR 88 | Wt 176.3 lb

## 2016-10-29 DIAGNOSIS — Z789 Other specified health status: Secondary | ICD-10-CM

## 2016-10-29 DIAGNOSIS — O0993 Supervision of high risk pregnancy, unspecified, third trimester: Secondary | ICD-10-CM

## 2016-10-29 DIAGNOSIS — Q600 Renal agenesis, unilateral: Secondary | ICD-10-CM | POA: Diagnosis not present

## 2016-10-29 DIAGNOSIS — O3403 Maternal care for unspecified congenital malformation of uterus, third trimester: Secondary | ICD-10-CM | POA: Diagnosis not present

## 2016-10-29 LAB — POCT URINALYSIS DIP (DEVICE)
BILIRUBIN URINE: NEGATIVE
Glucose, UA: NEGATIVE mg/dL
HGB URINE DIPSTICK: NEGATIVE
KETONES UR: NEGATIVE mg/dL
Leukocytes, UA: NEGATIVE
Nitrite: NEGATIVE
PH: 7 (ref 5.0–8.0)
Protein, ur: NEGATIVE mg/dL
Specific Gravity, Urine: 1.02 (ref 1.005–1.030)
UROBILINOGEN UA: 0.2 mg/dL (ref 0.0–1.0)

## 2016-10-29 NOTE — Progress Notes (Signed)
- 

## 2016-10-29 NOTE — Progress Notes (Signed)
Prenatal Visit Note Date: 10/29/2016 Clinic: [facility]  Subjective:  Carol [name] is a 21 y.o. G2P0101 at 104w2d being seen today for ongoing prenatal care.  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  Patient reports no complaints.   Contractions: Not present. Vag. Bleeding: None.  Movement: Present. Denies leaking of fluid.   The following portions of the patient's history were reviewed and updated as appropriate: allergies, current medications, past family history, past medical history, past social history, past surgical history and problem list. Problem list updated.  Objective:   Vitals:   10/29/16 0745  BP: (!) 113/52  Pulse: 88  Weight: 176 lb 4.8 oz (80 kg)    Fetal Status: Fetal Heart Rate (bpm): 140 Fundal Height: 30 cm Movement: Present     General:  Alert, oriented and cooperative. Patient is in no acute distress.  Skin: Skin is warm and dry. No rash noted.   Cardiovascular: Normal heart rate noted  Respiratory: Normal respiratory effort, no problems with respiration noted  Abdomen: Soft, gravid, appropriate for gestational age. Pain/Pressure: Present     Pelvic:  Cervical exam deferred        Extremities: Normal range of motion.  Edema: None  Mental Status: Normal mood and affect. Normal behavior. Normal judgment and thought content.   Urinalysis:      Assessment and Plan:  Pregnancy: G2P0101 at [redacted]w[redacted]d  1. Supervision of high risk pregnancy in third trimester Routine care. Patient had wrong GTT last time (fasting and 2hr) and she is amenable to re-doing today since she's fasting.  - Comprehensive metabolic panel - Glucose Tolerance, 2 Hours w/1 Hour - Protein /  creatinine ratio, urine  2. Language barrier Interpreter used.   3. Congenital abnormality of uterus during pregnancy in third trimester Will check CMP and PC since I don't see one in the chart given only one maternal kidney Recommend 32wk AFI with either once or twice weekly testing given her history. Follow up MFM u/s next week to see if any change in AP testing recommendations.   Preterm labor symptoms and general obstetric precautions including but not limited to vaginal bleeding, contractions, leaking of fluid and fetal movement were reviewed in detail with the patient. Please refer to After Visit Summary for other counseling recommendations.  Return in about 2 weeks (around 11/12/2016) for rob.    Bing, MD

## 2016-10-30 LAB — COMPREHENSIVE METABOLIC PANEL
A/G RATIO: 1.2 (ref 1.2–2.2)
ALT: 11 IU/L (ref 0–32)
AST: 13 IU/L (ref 0–40)
Albumin: 3.3 g/dL — ABNORMAL LOW (ref 3.5–5.5)
Alkaline Phosphatase: 81 IU/L (ref 39–117)
BUN / CREAT RATIO: 15 (ref 9–23)
BUN: 5 mg/dL — ABNORMAL LOW (ref 6–20)
CHLORIDE: 104 mmol/L (ref 96–106)
CO2: 22 mmol/L (ref 18–29)
Calcium: 8.5 mg/dL — ABNORMAL LOW (ref 8.7–10.2)
Creatinine, Ser: 0.33 mg/dL — ABNORMAL LOW (ref 0.57–1.00)
GFR calc non Af Amer: 157 mL/min/{1.73_m2} (ref >59)
GFR, EST AFRICAN AMERICAN: 181 mL/min/{1.73_m2} (ref >59)
GLOBULIN, TOTAL: 2.8 g/dL (ref 1.5–4.5)
Glucose: 87 mg/dL (ref 65–99)
POTASSIUM: 4 mmol/L (ref 3.5–5.2)
SODIUM: 139 mmol/L (ref 134–144)
TOTAL PROTEIN: 6.1 g/dL (ref 6.0–8.5)

## 2016-10-30 LAB — PROTEIN / CREATININE RATIO, URINE
Creatinine, Urine: 34.3 mg/dL
PROTEIN/CREAT RATIO: 446 mg/g{creat} — AB (ref 0–200)
Protein, Ur: 15.3 mg/dL

## 2016-10-30 LAB — GLUCOSE TOLERANCE, 2 HOURS W/ 1HR
GLUCOSE, 2 HOUR: 108 mg/dL (ref 65–152)
Glucose, 1 hour: 143 mg/dL (ref 65–179)
Glucose, Fasting: 81 mg/dL (ref 65–91)

## 2016-10-31 ENCOUNTER — Encounter: Payer: Self-pay | Admitting: Obstetrics and Gynecology

## 2016-10-31 DIAGNOSIS — O1213 Gestational proteinuria, third trimester: Secondary | ICD-10-CM | POA: Insufficient documentation

## 2016-11-03 ENCOUNTER — Telehealth: Payer: Self-pay | Admitting: *Deleted

## 2016-11-03 ENCOUNTER — Ambulatory Visit (HOSPITAL_COMMUNITY)
Admission: RE | Admit: 2016-11-03 | Discharge: 2016-11-03 | Disposition: A | Discharge status: Home / Self Care | Payer: Medicaid Other | Source: Ambulatory Visit | Attending: Obstetrics & Gynecology | Admitting: Obstetrics & Gynecology

## 2016-11-03 ENCOUNTER — Other Ambulatory Visit (HOSPITAL_COMMUNITY): Payer: Self-pay | Admitting: Maternal and Fetal Medicine

## 2016-11-03 ENCOUNTER — Other Ambulatory Visit: Payer: Medicaid Other

## 2016-11-03 ENCOUNTER — Other Ambulatory Visit (HOSPITAL_COMMUNITY): Payer: Self-pay | Admitting: *Deleted

## 2016-11-03 ENCOUNTER — Encounter (HOSPITAL_COMMUNITY): Payer: Self-pay

## 2016-11-03 DIAGNOSIS — O09213 Supervision of pregnancy with history of pre-term labor, third trimester: Secondary | ICD-10-CM

## 2016-11-03 DIAGNOSIS — Q512 Other doubling of uterus: Secondary | ICD-10-CM | POA: Diagnosis not present

## 2016-11-03 DIAGNOSIS — Z87798 Personal history of other (corrected) congenital malformations: Secondary | ICD-10-CM

## 2016-11-03 DIAGNOSIS — O34593 Maternal care for other abnormalities of gravid uterus, third trimester: Secondary | ICD-10-CM | POA: Insufficient documentation

## 2016-11-03 DIAGNOSIS — O34599 Maternal care for other abnormalities of gravid uterus, unspecified trimester: Secondary | ICD-10-CM

## 2016-11-03 DIAGNOSIS — Z362 Encounter for other antenatal screening follow-up: Secondary | ICD-10-CM

## 2016-11-03 DIAGNOSIS — O09893 Supervision of other high risk pregnancies, third trimester: Secondary | ICD-10-CM

## 2016-11-03 DIAGNOSIS — O321XX0 Maternal care for breech presentation, not applicable or unspecified: Secondary | ICD-10-CM | POA: Diagnosis present

## 2016-11-03 DIAGNOSIS — Q5128 Other doubling of uterus, other specified: Secondary | ICD-10-CM

## 2016-11-03 DIAGNOSIS — Z3A30 30 weeks gestation of pregnancy: Secondary | ICD-10-CM

## 2016-11-03 DIAGNOSIS — O09899 Supervision of other high risk pregnancies, unspecified trimester: Secondary | ICD-10-CM

## 2016-11-03 DIAGNOSIS — Q600 Renal agenesis, unilateral: Secondary | ICD-10-CM

## 2016-11-03 DIAGNOSIS — O3403 Maternal care for unspecified congenital malformation of uterus, third trimester: Secondary | ICD-10-CM

## 2016-11-03 DIAGNOSIS — O09219 Supervision of pregnancy with history of pre-term labor, unspecified trimester: Secondary | ICD-10-CM

## 2016-11-03 DIAGNOSIS — O34592 Maternal care for other abnormalities of gravid uterus, second trimester: Secondary | ICD-10-CM

## 2016-11-03 NOTE — Telephone Encounter (Signed)
Carol [name] called back and was notified by registrar needs to come in for lab only appt for urine - agreed to appt in am.

## 2016-11-03 NOTE — Telephone Encounter (Signed)
Per message from Dr. Vergie Living need to call patient and notify her needs to come in for 24 urine because she may be spilling protein into her urine based on her pc ratio. I called Carol [name] with [employer] interpreter [phone] and left a message we are calling with some information- please call the office.

## 2016-11-12 ENCOUNTER — Ambulatory Visit (INDEPENDENT_AMBULATORY_CARE_PROVIDER_SITE_OTHER): Payer: Medicaid Other | Admitting: Obstetrics & Gynecology

## 2016-11-12 VITALS — BP 117/55 | HR 87 | Wt 177.6 lb

## 2016-11-12 DIAGNOSIS — O09899 Supervision of other high risk pregnancies, unspecified trimester: Secondary | ICD-10-CM

## 2016-11-12 DIAGNOSIS — O09219 Supervision of pregnancy with history of pre-term labor, unspecified trimester: Secondary | ICD-10-CM

## 2016-11-12 DIAGNOSIS — Q600 Renal agenesis, unilateral: Secondary | ICD-10-CM

## 2016-11-12 DIAGNOSIS — O1213 Gestational proteinuria, third trimester: Secondary | ICD-10-CM | POA: Diagnosis not present

## 2016-11-12 DIAGNOSIS — O0993 Supervision of high risk pregnancy, unspecified, third trimester: Secondary | ICD-10-CM

## 2016-11-12 NOTE — Progress Notes (Signed)
Spanish Interpreter [employer]

## 2016-11-12 NOTE — Patient Instructions (Signed)
Regrese a la clinica cuando tenga su cita. Si tiene problemas o preguntas, llama a la clinica o vaya a la sala de emergencia al [facility].    

## 2016-11-12 NOTE — Progress Notes (Addendum)
Please see full note.  Carol Newcomer, MD

## 2016-11-12 NOTE — Progress Notes (Signed)
PRENATAL VISIT NOTE  Subjective:  Carol [name] is a 21 y.o. G2P0101 at [redacted]w[redacted]d being seen today for ongoing prenatal care.  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 Patient is Spanish-speaking only, Spanish interpreter present for this encounter.  Patient reports no complaints.  Contractions: Not present. Vag. Bleeding: None.  Movement: Present. Denies leaking of fluid.   The following portions of the patient's history were reviewed and updated as appropriate: allergies, current medications, past family history, past medical history, past social history, past surgical history and problem list. Problem list updated.  Objective:   Vitals:   11/12/16 0749  BP: (!) 117/55  Pulse: 87  Weight: 177 lb 9.6 oz (80.6 kg)    Fetal Status: Fetal Heart Rate (bpm): 152 Fundal Height: 32 cm Movement: Present     General:  Alert, oriented and cooperative. Patient is in no acute distress.  Skin: Skin is warm and dry. No rash noted.   Cardiovascular: Normal heart rate noted  Respiratory: Normal respiratory effort, no problems with respiration noted  Abdomen: Soft, gravid, appropriate for gestational age. Pain/Pressure: Absent     Pelvic:  Cervical exam deferred        Extremities: Normal range of motion.  Edema: None  Mental Status: Normal mood and affect. Normal behavior. Normal judgment and thought content.    US Renal  Result Date: 10/19/2016 CLINICAL DATA:  Didelphys uterus, evaluate for renal abnormalities. The patient is approximately [redacted] weeks pregnant. EXAM: RENAL / URINARY TRACT ULTRASOUND COMPLETE COMPARISON:  None impacts FINDINGS: Right Kidney: No right  kidney is observed. Left Kidney: Length: The left kidney measures 14.5 cm in length. The renal cortical echotexture is normal. There is mild splitting of the central echo complex. Bladder: The partially distended urinary bladder is normal. IMPRESSION: Nonvisualization of the right kidney consistent with right renal agenesis which can be seen with didelphys uterus. Mild left-sided hydronephrosis. The urinary bladder is unremarkable. Electronically Signed   By: David  [place] M.D.   On: 10/19/2016 13:32   [place] Mfm Ob Follow Up  Result Date: 11/03/2016 ----------------------------------------------------------------------  OBSTETRICS REPORT                      (Signed Final 11/03/2016 03:48 pm) ---------------------------------------------------------------------- Patient Info  ID #:       [id number]                         D.O.B.:   1995/12/22 (21 yrs)  Name:       Carol [name]                Visit Date:  11/03/2016 02:36 pm              MORAN ---------------------------------------------------------------------- Performed By  Performed By:     Tommie Raymond BS,       Secondary Phy.:   [facility]                    RDMS, RVT  [facility]  Attending:        Erle Crocker MD     Address:          [facility]                                                             [address]                                                             [place], [place]                                                             [address]  Referred By:      Jethro Bastos               Location:         [facility]  Ref. Address:     [address]                    [place], [place]                    [address] ---------------------------------------------------------------------- Orders   #  Description  Code   1  [place] MFM OB FOLLOW UP                         [id number]  ----------------------------------------------------------------------   #  Ordered By               Order #        Accession #    Episode #   1  Alpha Gula            [id number]      [id number]     [id number]  ---------------------------------------------------------------------- Indications   [redacted] weeks gestation of pregnancy                Z3A.30   Uterine abnormality during pregnancy           O34.599   (uterus didelphys)   Poor obstetric history: Previous preterm       O09.219   delivery (induced at 34wks due to oligo)   Personal history of congenital abnormality     Z87.798   (Right Renal Agenesis)   Encounter for other antenatal screening        Z36.2   follow-up  ---------------------------------------------------------------------- OB History  Blood Type:            Height:  5'4"   Weight (lb):  158      BMI:   27.12  Gravidity:    2         Term:   1        Prem:   0        SAB:   0  TOP:          0       Ectopic:  0        Living: 1 ---------------------------------------------------------------------- Fetal Evaluation  Num Of Fetuses:     1  Preg. Location:     Intrauterine in Right horn  Fetal Heart         151  Rate(bpm):  Cardiac Activity:   Observed  Presentation:       Breech  Placenta:           Anterior, above cervical os  P. Cord Insertion:  Previously Visualized  Amniotic Fluid  AFI FV:      Subjectively within normal limits  AFI Sum(cm)     %Tile       Largest Pocket(cm)  11.07           22          4.91  RUQ(cm)       RLQ(cm)       LUQ(cm)        LLQ(cm)  2.48          2.15          1.53           4.91 ---------------------------------------------------------------------- Biometry  BPD:      75.5  mm     G. Age:  30w 2d         47  %    CI:         78.78   %   70 - 86  FL/HC:      20.6   %   19.2 - 21.4  HC:       269   mm     G. Age:  29w 2d          7  %    HC/AC:      1.06       0.99 - 1.21  AC:      253.8  mm     G. Age:  29w 4d         32  %    FL/BPD:     73.2   %   71 - 87  FL:       55.3  mm     G. Age:  29w 1d         16  %    FL/AC:      21.8   %   20 - 24  HUM:      49.7  mm     G. Age:  29w 1d         35  %  CER:      35.1  mm     G. Age:  30w 1d         51  %  CM:        8.8  mm  Est. FW:    1397  gm      3 lb 1 oz     42  % ---------------------------------------------------------------------- Gestational Age  U/S Today:     29w 4d                                        EDD:   01/15/17  Best:          30w 0d    Det. ByMarcella Dubs         EDD:   01/12/17                                      (06/29/16) ---------------------------------------------------------------------- Anatomy  Cranium:               Appears normal         Aortic Arch:            Previously seen  Cavum:                 Previously seen        Ductal Arch:            Previously seen  Ventricles:            Appears normal         Diaphragm:              Appears normal  Choroid Plexus:        Previously seen        Stomach:                Appears normal, left  sided  Cerebellum:            Previously seen        Abdomen:                Previously seen  Posterior Fossa:       Previously seen        Abdominal Wall:         Previously seen  Nuchal Fold:           Previously seen        Cord Vessels:           Previously seen  Face:                  Orbits and profile     Kidneys:                Appear normal                         previously seen  Lips:                  Previously seen        Bladder:                Appears normal  Thoracic:              Previously seen        Spine:                  Previously seen  Heart:                  Previously seen        Upper Extremities:      Previously seen  RVOT:                  Previously seen        Lower Extremities:      Previously seen  LVOT:                  Previously seen  Other:  Female gender previously seen.  5th digit previously seen.  Technically          difficult due to advanced GA and fetal position. ---------------------------------------------------------------------- Cervix Uterus Adnexa  Cervix  Not visualized (advanced GA >29wks)  Uterus  Uterus didelphys.  Left Ovary  Size(cm)     1.92  x    1.99   x  1.26      Vol(ml): 2.5  Within normal limits. No adnexal mass visualized.  Right Ovary  Size(cm)     2.01  x    2.33   x  1.74      Vol(ml): 4.3  Within normal limits. No adnexal mass visualized.  Cul De Sac:   No free fluid seen.  Adnexa:       No abnormality visualized. ---------------------------------------------------------------------- Impression  Indication: 21 yr old G28P0101 at [redacted]w[redacted]d with uterine  didelphys for fetal growth.  Findings:  1. Single intrauterine pregnancy.  2. Estimated fetal weight is in the 42nd%.  3. Anterior placenta without evidence of previa.  4. Normal amniotic fluid volume.  5. The limited anatomy survey is normal. ---------------------------------------------------------------------- Recommendations  1. Appropriate fetal growth.  2. Uterine didelphys:  - recommend serial fetal growth  - recommend preterm labor precautions  3. Previous preterm delivery was induction at 34 weeks due  to presumed PPROM  - recommend preterm  labor precautions  4. Normal MSAFP ----------------------------------------------------------------------                Erle Crocker, MD Electronically Signed Final Report   11/03/2016 03:48 pm ----------------------------------------------------------------------   Assessment and Plan:  Pregnancy: G2P0101 at [redacted]w[redacted]d  1. Proteinuria affecting pregnancy in third trimester 2. Congenital right renal agenesis (maternal) Elevated urine  P: C ratio, 24 hour urine submitted today. - Creatinine Clearance, Urine, 24 hour - Protein, Urine, 24 hour - Comprehensive metabolic panel No current need to start antenatal testing.   3. History of preterm delivery, currently pregnant No symptoms. Precautions advised.   4. Supervision of high risk pregnancy in third trimester Preterm labor symptoms and general obstetric precautions including but not limited to vaginal bleeding, contractions, leaking of fluid and fetal movement were reviewed in detail with the patient. Please refer to After Visit Summary for other counseling recommendations.  Return in about 2 weeks (around 11/26/2016) for OB Visit.   Tereso Newcomer, MD

## 2016-11-13 LAB — COMPREHENSIVE METABOLIC PANEL
A/G RATIO: 1.2 (ref 1.2–2.2)
ALK PHOS: 92 IU/L (ref 39–117)
ALT: 12 IU/L (ref 0–32)
AST: 13 IU/L (ref 0–40)
Albumin: 3.5 g/dL (ref 3.5–5.5)
BUN/Creatinine Ratio: 10 (ref 9–23)
BUN: 5 mg/dL — ABNORMAL LOW (ref 6–20)
CALCIUM: 9.3 mg/dL (ref 8.7–10.2)
CHLORIDE: 105 mmol/L (ref 96–106)
CO2: 17 mmol/L — ABNORMAL LOW (ref 18–29)
Creatinine, Ser: 0.48 mg/dL — ABNORMAL LOW (ref 0.57–1.00)
GFR calc Af Amer: 160 mL/min/{1.73_m2} (ref >59)
GFR calc non Af Amer: 139 mL/min/{1.73_m2} (ref >59)
GLOBULIN, TOTAL: 2.9 g/dL (ref 1.5–4.5)
Glucose: 100 mg/dL — ABNORMAL HIGH (ref 65–99)
POTASSIUM: 4.3 mmol/L (ref 3.5–5.2)
SODIUM: 144 mmol/L (ref 134–144)
Total Protein: 6.4 g/dL (ref 6.0–8.5)

## 2016-11-13 LAB — CREATININE CLEARANCE, URINE, 24 HOUR: Creatinine, Urine: 40.7 mg/dL

## 2016-11-13 LAB — PROTEIN, URINE, 24 HOUR: PROTEIN UR: 14.3 mg/dL

## 2016-11-22 ENCOUNTER — Inpatient Hospital Stay (HOSPITAL_COMMUNITY): Payer: Medicaid Other

## 2016-11-22 ENCOUNTER — Encounter (HOSPITAL_COMMUNITY): Payer: Self-pay | Admitting: Anesthesiology

## 2016-11-22 ENCOUNTER — Encounter (HOSPITAL_COMMUNITY): Payer: Self-pay

## 2016-11-22 ENCOUNTER — Inpatient Hospital Stay (HOSPITAL_COMMUNITY)
Admission: AD | Admit: 2016-11-22 | Discharge: 2016-11-24 | DRG: 781 | Disposition: A | Discharge status: Home / Self Care | Payer: Medicaid Other | Source: Ambulatory Visit | Attending: Obstetrics & Gynecology | Admitting: Obstetrics & Gynecology

## 2016-11-22 ENCOUNTER — Encounter (HOSPITAL_COMMUNITY)
Admission: AD | Disposition: A | Discharge status: Home / Self Care | Payer: Self-pay | Source: Ambulatory Visit | Attending: Obstetrics & Gynecology

## 2016-11-22 DIAGNOSIS — O133 Gestational [pregnancy-induced] hypertension without significant proteinuria, third trimester: Secondary | ICD-10-CM | POA: Diagnosis present

## 2016-11-22 DIAGNOSIS — O1213 Gestational proteinuria, third trimester: Secondary | ICD-10-CM

## 2016-11-22 DIAGNOSIS — O09213 Supervision of pregnancy with history of pre-term labor, third trimester: Secondary | ICD-10-CM | POA: Diagnosis not present

## 2016-11-22 DIAGNOSIS — R310 Gross hematuria: Secondary | ICD-10-CM

## 2016-11-22 DIAGNOSIS — R319 Hematuria, unspecified: Secondary | ICD-10-CM

## 2016-11-22 DIAGNOSIS — Q600 Renal agenesis, unilateral: Secondary | ICD-10-CM | POA: Diagnosis not present

## 2016-11-22 DIAGNOSIS — O3403 Maternal care for unspecified congenital malformation of uterus, third trimester: Secondary | ICD-10-CM

## 2016-11-22 DIAGNOSIS — O0993 Supervision of high risk pregnancy, unspecified, third trimester: Secondary | ICD-10-CM

## 2016-11-22 DIAGNOSIS — O0990 Supervision of high risk pregnancy, unspecified, unspecified trimester: Secondary | ICD-10-CM

## 2016-11-22 DIAGNOSIS — O9989 Other specified diseases and conditions complicating pregnancy, childbirth and the puerperium: Secondary | ICD-10-CM | POA: Diagnosis present

## 2016-11-22 DIAGNOSIS — R197 Diarrhea, unspecified: Secondary | ICD-10-CM

## 2016-11-22 DIAGNOSIS — O2300 Infections of kidney in pregnancy, unspecified trimester: Secondary | ICD-10-CM | POA: Diagnosis present

## 2016-11-22 DIAGNOSIS — O321XX0 Maternal care for breech presentation, not applicable or unspecified: Secondary | ICD-10-CM | POA: Diagnosis present

## 2016-11-22 DIAGNOSIS — O09219 Supervision of pregnancy with history of pre-term labor, unspecified trimester: Secondary | ICD-10-CM | POA: Diagnosis not present

## 2016-11-22 DIAGNOSIS — N200 Calculus of kidney: Secondary | ICD-10-CM | POA: Diagnosis present

## 2016-11-22 DIAGNOSIS — N1330 Unspecified hydronephrosis: Secondary | ICD-10-CM | POA: Diagnosis present

## 2016-11-22 DIAGNOSIS — O163 Unspecified maternal hypertension, third trimester: Secondary | ICD-10-CM

## 2016-11-22 DIAGNOSIS — R34 Anuria and oliguria: Secondary | ICD-10-CM

## 2016-11-22 DIAGNOSIS — Q512 Other doubling of uterus: Secondary | ICD-10-CM | POA: Diagnosis not present

## 2016-11-22 DIAGNOSIS — Z3A32 32 weeks gestation of pregnancy: Secondary | ICD-10-CM | POA: Diagnosis not present

## 2016-11-22 DIAGNOSIS — O3400 Maternal care for unspecified congenital malformation of uterus, unspecified trimester: Secondary | ICD-10-CM | POA: Diagnosis present

## 2016-11-22 DIAGNOSIS — R112 Nausea with vomiting, unspecified: Secondary | ICD-10-CM

## 2016-11-22 DIAGNOSIS — O2303 Infections of kidney in pregnancy, third trimester: Secondary | ICD-10-CM | POA: Diagnosis present

## 2016-11-22 DIAGNOSIS — A084 Viral intestinal infection, unspecified: Secondary | ICD-10-CM

## 2016-11-22 DIAGNOSIS — Z789 Other specified health status: Secondary | ICD-10-CM | POA: Diagnosis present

## 2016-11-22 DIAGNOSIS — O09899 Supervision of other high risk pregnancies, unspecified trimester: Secondary | ICD-10-CM

## 2016-11-22 LAB — COMPREHENSIVE METABOLIC PANEL
ALBUMIN: 2.8 g/dL — AB (ref 3.5–5.0)
ALBUMIN: 2.5 g/dL — AB (ref 3.5–5.0)
ALT: 13 U/L — ABNORMAL LOW (ref 14–54)
ALT: 13 U/L — ABNORMAL LOW (ref 14–54)
ANION GAP: 10 (ref 5–15)
ANION GAP: 9 (ref 5–15)
AST: 22 U/L (ref 15–41)
AST: 24 U/L (ref 15–41)
Alkaline Phosphatase: 87 U/L (ref 38–126)
Alkaline Phosphatase: 80 U/L (ref 38–126)
BILIRUBIN TOTAL: 0.4 mg/dL (ref 0.3–1.2)
BILIRUBIN TOTAL: 0.4 mg/dL (ref 0.3–1.2)
BUN: 17 mg/dL (ref 6–20)
BUN: 17 mg/dL (ref 6–20)
CALCIUM: 8.4 mg/dL — AB (ref 8.9–10.3)
CHLORIDE: 105 mmol/L (ref 101–111)
CO2: 20 mmol/L — ABNORMAL LOW (ref 22–32)
CO2: 21 mmol/L — AB (ref 22–32)
Calcium: 7.9 mg/dL — ABNORMAL LOW (ref 8.9–10.3)
Chloride: 105 mmol/L (ref 101–111)
Creatinine, Ser: 1.62 mg/dL — ABNORMAL HIGH (ref 0.44–1.00)
Creatinine, Ser: 2.06 mg/dL — ABNORMAL HIGH (ref 0.44–1.00)
GFR calc Af Amer: 38 mL/min — ABNORMAL LOW (ref >60)
GFR calc non Af Amer: 44 mL/min — ABNORMAL LOW (ref >60)
GFR calc non Af Amer: 33 mL/min — ABNORMAL LOW (ref >60)
GFR, EST AFRICAN AMERICAN: 51 mL/min — AB (ref >60)
GLUCOSE: 80 mg/dL (ref 65–99)
GLUCOSE: 79 mg/dL (ref 65–99)
POTASSIUM: 3.9 mmol/L (ref 3.5–5.1)
POTASSIUM: 3.9 mmol/L (ref 3.5–5.1)
SODIUM: 135 mmol/L (ref 135–145)
Sodium: 135 mmol/L (ref 135–145)
TOTAL PROTEIN: 5.9 g/dL — AB (ref 6.5–8.1)
TOTAL PROTEIN: 5.3 g/dL — AB (ref 6.5–8.1)

## 2016-11-22 LAB — CBC WITH DIFFERENTIAL/PLATELET
BASOS ABS: 0 10*3/uL (ref 0.0–0.1)
BASOS PCT: 0 %
EOS PCT: 0 %
Eosinophils Absolute: 0 10*3/uL (ref 0.0–0.7)
HCT: 34.3 % — ABNORMAL LOW (ref 36.0–46.0)
Hemoglobin: 11.6 g/dL — ABNORMAL LOW (ref 12.0–15.0)
LYMPHS PCT: 13 %
Lymphs Abs: 1.8 10*3/uL (ref 0.7–4.0)
MCH: 31 pg (ref 26.0–34.0)
MCHC: 33.8 g/dL (ref 30.0–36.0)
MCV: 91.7 fL (ref 78.0–100.0)
MONO ABS: 0.7 10*3/uL (ref 0.1–1.0)
MONOS PCT: 5 %
NEUTROS ABS: 11.5 10*3/uL — AB (ref 1.7–7.7)
Neutrophils Relative %: 82 %
PLATELETS: 195 10*3/uL (ref 150–400)
RBC: 3.74 MIL/uL — ABNORMAL LOW (ref 3.87–5.11)
RDW: 12.8 % (ref 11.5–15.5)
WBC: 14.1 10*3/uL — ABNORMAL HIGH (ref 4.0–10.5)

## 2016-11-22 LAB — URINALYSIS, ROUTINE W REFLEX MICROSCOPIC
Bacteria, UA: NONE SEEN
Bilirubin Urine: NEGATIVE
GLUCOSE, UA: 50 mg/dL — AB
KETONES UR: NEGATIVE mg/dL
Nitrite: NEGATIVE
PH: 6 (ref 5.0–8.0)
Protein, ur: 100 mg/dL — AB
Specific Gravity, Urine: 1.013 (ref 1.005–1.030)

## 2016-11-22 LAB — PROTEIN / CREATININE RATIO, URINE
Creatinine, Urine: 184 mg/dL
PROTEIN CREATININE RATIO: 0.92 mg/mg{creat} — AB (ref 0.00–0.15)
Total Protein, Urine: 170 mg/dL

## 2016-11-22 LAB — CREATININE, SERUM
Creatinine, Ser: 2.11 mg/dL — ABNORMAL HIGH (ref 0.44–1.00)
GFR calc Af Amer: 37 mL/min — ABNORMAL LOW (ref >60)
GFR, EST NON AFRICAN AMERICAN: 32 mL/min — AB (ref >60)

## 2016-11-22 LAB — TYPE AND SCREEN
ABO/RH(D): O POS
ANTIBODY SCREEN: NEGATIVE

## 2016-11-22 SURGERY — CYSTOSCOPY, WITH STENT INSERTION
Anesthesia: Choice

## 2016-11-22 SURGERY — Surgical Case
Anesthesia: *Unknown

## 2016-11-22 MED ORDER — HYDROMORPHONE HCL 1 MG/ML IJ SOLN
1.0000 mg | INTRAMUSCULAR | Status: DC | PRN
  Administered 2016-11-22: 2 mg via INTRAVENOUS
  Filled 2016-11-22: qty 2

## 2016-11-22 MED ORDER — DOCUSATE SODIUM 100 MG PO CAPS
100.0000 mg | ORAL_CAPSULE | Freq: Every day | ORAL | Status: DC
  Filled 2016-11-22: qty 1

## 2016-11-22 MED ORDER — SCOPOLAMINE 1 MG/3DAYS TD PT72
MEDICATED_PATCH | TRANSDERMAL | Status: AC
  Filled 2016-11-22: qty 1

## 2016-11-22 MED ORDER — SODIUM CHLORIDE 0.9 % IV SOLN
Freq: Once | INTRAVENOUS | Status: DC
Start: 2016-11-22 — End: 2016-11-24

## 2016-11-22 MED ORDER — BETAMETHASONE SOD PHOS & ACET 6 (3-3) MG/ML IJ SUSP: 12.0000 mg | INTRAMUSCULAR | Status: DC

## 2016-11-22 MED ORDER — CALCIUM CARBONATE ANTACID 500 MG PO CHEW: 2.0000 | CHEWABLE_TABLET | ORAL | Status: DC | PRN

## 2016-11-22 MED ORDER — ONDANSETRON 8 MG PO TBDP
8.0000 mg | ORAL_TABLET | Freq: Once | ORAL | Status: AC
  Administered 2016-11-22: 8 mg via ORAL
  Filled 2016-11-22: qty 1

## 2016-11-22 MED ORDER — ACETAMINOPHEN 325 MG PO TABS: 650.0000 mg | ORAL_TABLET | ORAL | Status: DC | PRN

## 2016-11-22 MED ORDER — ZOLPIDEM TARTRATE 5 MG PO TABS: 5.0000 mg | ORAL_TABLET | Freq: Every evening | ORAL | Status: DC | PRN

## 2016-11-22 MED ORDER — PRENATAL MULTIVITAMIN CH
1.0000 | ORAL_TABLET | Freq: Every day | ORAL | Status: DC
  Administered 2016-11-23: 1 via ORAL
  Filled 2016-11-22: qty 1

## 2016-11-22 MED ORDER — LACTATED RINGERS IV BOLUS (SEPSIS)
1000.0000 mL | Freq: Once | INTRAVENOUS | Status: AC
  Administered 2016-11-22: 1000 mL via INTRAVENOUS

## 2016-11-22 MED ORDER — ACETAMINOPHEN-CODEINE #3 300-30 MG PO TABS
2.0000 | ORAL_TABLET | Freq: Once | ORAL | Status: AC
  Administered 2016-11-22: 2 via ORAL
  Filled 2016-11-22: qty 2

## 2016-11-22 MED ORDER — FENTANYL CITRATE (PF) 250 MCG/5ML IJ SOLN
INTRAMUSCULAR | Status: AC
  Filled 2016-11-22: qty 5

## 2016-11-22 MED ORDER — DEXTROSE 5 % IV SOLN
2.0000 g | INTRAVENOUS | Status: DC
  Administered 2016-11-22 – 2016-11-23 (×2): 2 g via INTRAVENOUS
  Filled 2016-11-22 (×2): qty 2

## 2016-11-22 MED ORDER — BETAMETHASONE SOD PHOS & ACET 6 (3-3) MG/ML IJ SUSP
12.0000 mg | INTRAMUSCULAR | Status: AC
  Administered 2016-11-22 – 2016-11-23 (×2): 12 mg via INTRAMUSCULAR
  Filled 2016-11-22 (×2): qty 2

## 2016-11-22 MED ORDER — LACTATED RINGERS IV SOLN
INTRAVENOUS | Status: AC
  Administered 2016-11-22: 20:00:00 via INTRAVENOUS

## 2016-11-22 MED ORDER — ONDANSETRON HCL 4 MG/2ML IJ SOLN
INTRAMUSCULAR | Status: AC
  Filled 2016-11-22: qty 10

## 2016-11-22 MED ORDER — OXYCODONE-ACETAMINOPHEN 5-325 MG PO TABS: 1.0000 | ORAL_TABLET | Freq: Four times a day (QID) | ORAL | Status: DC | PRN

## 2016-11-22 NOTE — MAU Note (Signed)
Pt presents to MAU with complaints of nausea, vomiting, diarrhea, lower abdominal pain and back pain since this morning. Denies any vaginal bleeding or abnormal discharge

## 2016-11-22 NOTE — H&P (Signed)
History   G2P0101 @ 32.5 wks in with nausea and vomiting and diarrhea since yesterday. States diarrhea has subsided but continues to have abd cramping and nausea. Denies ROM or vag bleeding.  CSN: [id number]  Arrival date & time 11/22/16  1611   None        Chief Complaint  Patient presents with  . Nausea  . Emesis  . Diarrhea  . Back Pain  . Abdominal Pain    HPI      Past Medical History:  Diagnosis Date  . 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 NO PAST SURGERIES           Family History  Problem Relation Age of Onset  . Diabetes Mother   . Diabetes Father         Social History  Substance Use Topics  . Smoking status: Never Smoker  . Smokeless tobacco: Never Used  . Alcohol use No            OB History    Gravida Para Term Preterm AB Living   SAB TAB Ectopic Multiple Live Births           1      Review of Systems  Constitutional: Negative.   HENT: Negative.   Eyes: Negative.   Respiratory: Negative.   Cardiovascular: Negative.   Gastrointestinal: Positive for abdominal pain, diarrhea, nausea and vomiting.  Endocrine: Negative.   Genitourinary: Negative.   Musculoskeletal: Negative.   Skin: Negative.   Neurological: Negative.   Hematological: Negative.   Psychiatric/Behavioral: Negative.     Allergies  Patient has no known allergies.  Home Medications    BP (!) 148/93   Pulse (!) 116   Temp 99.1 F (37.3 C)   Resp 18   Ht  (1.626 m)   Wt 182 lb (82.6 kg)   LMP  (LMP Unknown)   BMI 31.24 kg/m   Physical Exam  Constitutional: She is oriented to person, place, and time. She appears well-developed and well-nourished.  HENT:  Head: Normocephalic.  Eyes: Pupils are equal, round, and  reactive to light.  Neck: Normal range of motion.  Cardiovascular: Normal rate, regular rhythm, normal heart sounds and intact distal pulses.   Pulmonary/Chest: Effort normal and breath sounds normal.  Abdominal: Soft. Bowel sounds are normal.  Genitourinary: Vagina normal and uterus normal.  Musculoskeletal: Normal range of motion.  Neurological: She is alert and oriented to person, place, and time. She has normal reflexes.  Skin: Skin is warm and dry.  Psychiatric: She has a normal mood and affect. Her behavior is normal. Judgment and thought content normal.    MAU Course  Procedures (including critical care time)       Labs Reviewed  URINALYSIS, ROUTINE W REFLEX MICROSCOPIC - Abnormal; Notable for the following:       Result Value    Color, Urine RED (*)    APPearance CLOUDY (*)    Glucose, UA 50 (*)    Hgb urine dipstick LARGE (*)    Protein, ur 100 (*)    Leukocytes, UA TRACE (*)    Squamous Epithelial / LPF TOO NUMEROUS TO COUNT (*)    All other components  within normal limits   ImagingResults(Last48hours)  No results found.     1. Congenital renal agenesis, unilateral   2. History of preterm delivery, currently pregnant   3. Congenital abnormality of uterus during pregnancy in third trimester       MDM  Viral gastroenteritis vs preeclampsia SVE cl/th/post/high. FHR pattern reassuring. No uc's. BP elevated, will get PIH labs. Creatinine elevated. Dr. Erin Fulling called to evaluate pt.

## 2016-11-22 NOTE — MAU Provider Note (Deleted)
Patient Carol [name] is a 21 year old G2P0101 at 27 weeks and 2 days here after being seen in the [facility] for her prenatal visit. She complained of some tightening and was sent to MAU for evaluation. Her cervix was found to be 1-2 cm/50 cm at her prenatal visit today.   Her history is significant for suspected PPROM at 32 weeks, oligo, and IOL at 34 weeks in her past pregnancy (2016).  History     CSN: [id number]  Arrival date and time: 11/22/16 1611   First Provider Initiated Contact with Patient 10/15/16 1110      Chief Complaint  Patient presents with  . Nausea  . Emesis  . Diarrhea  . Back Pain  . Abdominal Pain   Abdominal Pain  This is a new problem. The current episode started today. The onset quality is gradual. The problem occurs intermittently. The problem has been unchanged. The pain is located in the generalized abdominal region. The pain is at a severity of 3/10. The quality of the pain is cramping. The abdominal pain does not radiate. Associated symptoms include diarrhea and vomiting. Pertinent negatives include no anorexia, arthralgias, belching, constipation or dysuria. The pain is aggravated by movement and eating. The pain is relieved by being still.  Emesis   Associated symptoms include abdominal pain and diarrhea. Pertinent negatives include no arthralgias.  Diarrhea   Associated symptoms include abdominal pain and vomiting. Pertinent negatives include no arthralgias.  Back Pain  Associated symptoms include abdominal pain. Pertinent negatives include no dysuria.  Patient states that she always gets occasional tightening in her abdomen when she stands for a long time at work or when she drinks a lot of water. When this happens so goes back out to her car to rest and she feels better.   OB History    Gravida Para Term Preterm AB Living   SAB TAB Ectopic Multiple Live Births           1      Past Medical History:  Diagnosis Date  . 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 NO PAST SURGERIES      Family History  Problem Relation Age of Onset  . Diabetes Mother   . Diabetes Father     Social History  Substance Use Topics  . Smoking status: Never Smoker  . Smokeless tobacco: Never Used  . Alcohol use No    Allergies: No Known Allergies  Prescriptions Prior to Admission  Medication Sig Dispense Refill Last Dose  . Prenatal Multivit-Min-Fe-FA (PRENATAL VITAMINS) 0.8 MG tablet Take 1 tablet by mouth daily. 30 tablet 12 11/21/2016 at Unknown time    Review of Systems  Gastrointestinal: Positive for abdominal pain, diarrhea and vomiting. Negative for anorexia and constipation.  Genitourinary: Negative for dysuria.  Musculoskeletal: Positive for back pain. Negative for arthralgias.   Physical Exam   Blood pressure (!) 148/93, pulse (!) 116, temperature 99.1 F (37.3 C), resp. rate 18, height  (1.626 m), weight 182 lb (82.6 kg).  Physical Exam  Constitutional: She is oriented to person, place, and time. She appears well-developed.  HENT:  Head: Normocephalic.  Eyes: Pupils are equal, round, and reactive to light.  Neck: Normal range of  motion.  Cardiovascular: Normal rate.   Respiratory: Effort normal.  GI: Soft.  Genitourinary:  Genitourinary Comments: NEFG; Cervix is 1-2, soft, posterior  Musculoskeletal: Normal range of motion.  Neurological: She is alert and oriented to person, place, and time.  Skin: Skin is warm and dry.     MAU Course  Procedures  MDM -PO hydration and snack -NST: 145 bpm, mod var, pres acel, neg decl, no contrations -Cervical exam unchanged after 2 hours   Assessment and Plan   1. Nausea vomiting and diarrhea   2. Congenital renal agenesis, unilateral   3. History of preterm delivery,  currently pregnant   4. Congenital abnormality of uterus during pregnancy in third trimester   5. Viral gastroenteritis    2. Patient stable for discharge; reviewed with patient when to return to the MAU (bleeding, leaking of fluid, decreased fetal movements) 3. Patient will keep her renal ultrasound appointment on 3-26, as well as her prenatal appoints and [place] on 4-5 and 4-10.    Wyvonnia Dusky CNM 11/22/2016, 6:27 PM

## 2016-11-22 NOTE — Consult Note (Signed)
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 She was admitted earlier today with nausea and vomiting and also noted left flank pain and by the time she got to the MAU she developed gross hematuria. A urinalysis was N neg, LE positive, tntc white cells and red cells, no bacteria, tntc squamous cells. Her creatinine at 5 PM was 1.62 up from 0.3 - 0.4. A foley was placed and she was bolused but had minimal urine output. Per nurse only a small amount of urine drained that was dark red. Her creatinine at 8 PM had gone up to 2.06. An ultrasound was done about 9:30 PM and at this time showed mild hydronephrosis consistent with her previous scans when she had normal kidney function. I reviewed the images. Because of the oliguria, solitary kidney and rising creatinine she was set up for urgent left ureteral stent placement. However, when Dr. Lowry Ram went to discuss plans with the patient, the patient was making copious urine and her flank pain resolved. The nurse drained 800 mL of clear urine. Currently, pt seen with the interpret. She has no pain. She has another 300 mL in bag and urine visibly filling the tube. Pt feels much better. Cr was repeated and seems to be leveling off at 2.11. Pt denies prior stone.     30 Day Unplanned Readmission Risk Score     Admission (Current) from 11/22/2016 in [facility]  30 Day Unplanned Readmission Risk Score (%)  8 Filed at 11/22/2016 2000     This score is the patient's risk of an unplanned readmission within 30 days of being discharged (0 -100%). The score is based on dignosis, age, lab data, medications, orders, and past utilization.   Low:  0-6.9   Medium: 7-14.9   High: 15-21.9   Extreme: 22 and above         Past Medical History:  Diagnosis Date  . 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 NO PAST SURGERIES      Home Medications:  Prescriptions Prior to Admission  Medication Sig Dispense Refill Last Dose  . Prenatal Multivit-Min-Fe-FA (PRENATAL VITAMINS) 0.8 MG tablet Take 1 tablet by mouth daily. 30 tablet 12 11/21/2016 at Unknown time   Allergies: No Known Allergies  Family History  Problem Relation Age of Onset  . Diabetes Mother   . Diabetes Father    Social History:  reports that she has never smoked. She has never used smokeless tobacco. She reports that she does not drink alcohol or use drugs.  ROS: A complete review of systems was performed.  All systems are negative except for pertinent findings as noted. Review of Systems  All other systems reviewed and are negative.    Physical Exam:  Vital signs in last 24 hours: Temp:  [98.9 F (37.2 C)-99.3 F (37.4 C)] 99.3 F (37.4 C) (04/29 2015) Pulse Rate:  [89-125] 99 (04/29 2228) Resp:  [18-20] 18 (04/29 2228) BP: (126-148)/(74-93) 144/87 (04/29 2015) SpO2:  [84 %-100 %] 97 % (04/29 2228) Weight:  [82.6 kg (182 lb)] 82.6 kg (182 lb) (04/29 2015)   No intake or output data in  the 24 hours ending 11/22/16 2301 No intake/output data recorded. 800 mL in urinal from draining foley bag Another 300 mL in bag   General:  Alert and oriented, No acute distress, looks well and in no pain, resting comfortably HEENT: Normocephalic, atraumatic Neck: No JVD or lymphadenopathy Cardiovascular: Regular rate and rhythm Lungs: Regular rate and effort Abdomen: Soft, nontender, nondistended, no abdominal masses,  Back: No CVA tenderness Extremities: No edema Neurologic: Grossly intact  Laboratory Data:  Results for orders placed or performed during the hospital encounter of 11/22/16 (from the past 24 hour(s))   Urinalysis, Routine w reflex microscopic     Status: Abnormal   Collection Time: 11/22/16  4:20 PM  Result Value Ref Range   Color, Urine RED (A) YELLOW   APPearance CLOUDY (A) CLEAR   Specific Gravity, Urine 1.013 1.005 - 1.030   pH 6.0 5.0 - 8.0   Glucose, UA 50 (A) NEGATIVE mg/dL   Hgb urine dipstick LARGE (A) NEGATIVE   Bilirubin Urine NEGATIVE NEGATIVE   Ketones, ur NEGATIVE NEGATIVE mg/dL   Protein, ur 161 (A) NEGATIVE mg/dL   Nitrite NEGATIVE NEGATIVE   Leukocytes, UA TRACE (A) NEGATIVE   RBC / HPF TOO NUMEROUS TO COUNT 0 - 5 RBC/hpf   WBC, UA TOO NUMEROUS TO COUNT 0 - 5 WBC/hpf   Bacteria, UA NONE SEEN NONE SEEN   Squamous Epithelial / LPF TOO NUMEROUS TO COUNT (A) NONE SEEN  CBC with Differential/Platelet     Status: Abnormal   Collection Time: 11/22/16  5:12 PM  Result Value Ref Range   WBC 14.1 (H) 4.0 - 10.5 K/uL   RBC 3.74 (L) 3.87 - 5.11 MIL/uL   Hemoglobin 11.6 (L) 12.0 - 15.0 g/dL   HCT 09.6 (L) 04.5 - 40.9 %   MCV 91.7 78.0 - 100.0 fL   MCH 31.0 26.0 - 34.0 pg   MCHC 33.8 30.0 - 36.0 g/dL   RDW 81.1 91.4 - 78.2 %   Platelets 195 150 - 400 K/uL   Neutrophils Relative % 82 %   Neutro Abs 11.5 (H) 1.7 - 7.7 K/uL   Lymphocytes Relative 13 %   Lymphs Abs 1.8 0.7 - 4.0 K/uL   Monocytes Relative 5 %   Monocytes Absolute 0.7 0.1 - 1.0 K/uL   Eosinophils Relative 0 %   Eosinophils Absolute 0.0 0.0 - 0.7 K/uL   Basophils Relative 0 %   Basophils Absolute 0.0 0.0 - 0.1 K/uL  Comprehensive metabolic panel     Status: Abnormal   Collection Time: 11/22/16  5:12 PM  Result Value Ref Range   Sodium 135 135 - 145 mmol/L   Potassium 3.9 3.5 - 5.1 mmol/L   Chloride 105 101 - 111 mmol/L   CO2 20 (L) 22 - 32 mmol/L   Glucose, Bld 80 65 - 99 mg/dL   BUN 17 6 - 20 mg/dL   Creatinine, Ser 9.56 (H) 0.44 - 1.00 mg/dL   Calcium 8.4 (L) 8.9 - 10.3 mg/dL   Total Protein 5.9 (L) 6.5 - 8.1 g/dL   Albumin 2.8 (L) 3.5 - 5.0 g/dL   AST 22 15 - 41 U/L   ALT 13 (L) 14 - 54 U/L    Alkaline Phosphatase 87 38 - 126 U/L   Total Bilirubin 0.4 0.3 - 1.2 mg/dL   GFR calc non Af Amer 44 (L) >60 mL/min   GFR calc Af Amer 51 (L) >60 mL/min   Anion gap 10 5 - 15  Type and screen [facility] [place]     Status: None   Collection Time: 11/22/16  7:58 PM  Result Value Ref Range   ABO/RH(D) O POS    Antibody Screen NEG    Sample Expiration 11/25/2016   Comprehensive metabolic panel     Status: Abnormal   Collection Time: 11/22/16  7:58 PM  Result Value Ref Range   Sodium 135 135 - 145 mmol/L   Potassium 3.9 3.5 - 5.1 mmol/L   Chloride 105 101 - 111 mmol/L   CO2 21 (L) 22 - 32 mmol/L   Glucose, Bld 79 65 - 99 mg/dL   BUN 17 6 - 20 mg/dL   Creatinine, Ser 1.61 (H) 0.44 - 1.00 mg/dL   Calcium 7.9 (L) 8.9 - 10.3 mg/dL   Total Protein 5.3 (L) 6.5 - 8.1 g/dL   Albumin 2.5 (L) 3.5 - 5.0 g/dL   AST 24 15 - 41 U/L   ALT 13 (L) 14 - 54 U/L   Alkaline Phosphatase 80 38 - 126 U/L   Total Bilirubin 0.4 0.3 - 1.2 mg/dL   GFR calc non Af Amer 33 (L) >60 mL/min   GFR calc Af Amer 38 (L) >60 mL/min   Anion gap 9 5 - 15  Protein / creatinine ratio, urine     Status: Abnormal   Collection Time: 11/22/16  9:00 PM  Result Value Ref Range   Creatinine, Urine 184.00 mg/dL   Total Protein, Urine 170 mg/dL   Protein Creatinine Ratio 0.92 (H) 0.00 - 0.15 mg/mg[Cre]   No results found for this or any previous visit (from the past 240 hour(s)). Creatinine:  Recent Labs  11/22/16 1712 11/22/16 1958  CREATININE 1.62* 2.06*    Impression/Assessment/plan: 1 - Oliguria/ARF - this is resolving. UOP now excellent. Follow Cr. If continues to rise may need repeat renal u/s or CT. I discussed with Dr. Erin Fulling and the patient the nature, r/b of surveillance, CT or proceeding with stent and pt elected surveillance.   2- N/V/flank pain/hematuria - resolved. ?stone event, uterine compression, papillary necrosis. Pt now smiling and doing well.   2 - solitary left kidney -  appeared stable, normal on renal u/s with only mild left hydronephrosis.    Darvell Monteforte 11/22/2016, 10:40 PM

## 2016-11-22 NOTE — Progress Notes (Signed)
Patient ID: Carol [name], female   DOB: [birth year], 23 y.o.   MRN: [id number] Pt with rising Cr- after fluid bolus. UO still low now with frank blood from urethra.  Suspect ureteral obstruction.  IVF's to [facility]. Renal US ordered STAT.  Nephrology and Urology notified. Will urology back after renal US results are back.  Mikylah Ackroyd L. Harraway-Smith, M.D., [place]

## 2016-11-22 NOTE — MAU Note (Addendum)
G2P1 @ 32.[redacted] wksga. Presents to triage for cold like symptoms. Denies LOF or bleeding. +FM.  EFM applied  1645: provider at bs. SVE closed. zofran ordered for nausea  2+ reflex, absent clonus. Denies ha or rib pain or vision changes.  1700: IV started. With LR bolus per order  1810: pt up to bathroom. Cup provided to collect more urine. 1818: pt back in bed. Unable to provide urine. Will try later. Provider made aware.   1820: Straight cathed and obtained 1ml and sent to lab. Dr. Burnice Logan ordered for culture instead of protein creatinine with scant amount of urine obtained.  1830: Dr. Burnice Logan at bs assessing. Pt states had voided around 1500 w/o problems. States has one kidney.   1902: Bladder scan done. 57ml noted. MD made aware.   1904: ABx up per order  BS U/S done by Dr. Burnice Logan. Breech presentation  1908: Betamethasone administered right buttocks.   Reported off to [employer].

## 2016-11-22 NOTE — Progress Notes (Signed)
Pt return from U/S via wheelchair with Delight Hoh, Charity fundraiser.

## 2016-11-22 NOTE — H&P (Signed)
FACULTY PRACTICE ANTEPARTUM ADMISSION HISTORY AND PHYSICAL NOTE  Pt presents with c/o pain in lower abd and back on left side. She reports that she developed N/V this am. Prior to today, she reports drinking a lot but, this am she had nausea with emesis and has not been drinking. She denies sick contacts.  She denies ctx, LOF or VB. She denies HA or RUQ pain. She has not tolerated po today. She does not have a h/o UTIs but, has congenital renal agenesis but, she does not know which kidney is affected.  She denies BP issues to date this pregnancy.   Pt did not note blood in her urine until she arrived in the MAU  History of Present Illness: Carol [name] is a 21 y.o. G2P0101 at [redacted]w[redacted]d admitted for suspected pyelonephritis and elevated BPs  Patient reports the fetal movement as active. Patient reports uterine contraction  activity as none. Patient reports  vaginal bleeding as none. Patient describes fluid per vagina as None. Fetal presentation is breech.  Patient Active Problem List   Diagnosis Date Noted  . Pyelonephritis affecting pregnancy 11/22/2016  . Proteinuria affecting pregnancy in third trimester 10/31/2016  . 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 Supervision of high-risk pregnancy 07/13/2016  . History of preterm delivery, currently pregnant 07/13/2016  . 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 Language barrier 03/02/2014    Past Medical History:  Diagnosis Date  . 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 NO PAST SURGERIES      OB History  Gravida Para Term Preterm AB Living  2 1   1   1   SAB TAB Ectopic Multiple Live Births          1    # Outcome Date GA Lbr Len/2nd Weight Sex Delivery Anes PTL Lv  2 Current            1 Preterm 05/22/14 [redacted]w[redacted]d 05:31 / 00:15 4 lb 9.7 oz (2.09 kg) M Vag-Spont EPI Y LIV      Social History   Social History  . Marital status: Single    Spouse name: N/A  . Number of children: N/A  . Years of education: N/A   Social History Main Topics  . Smoking status: Never Smoker  . Smokeless tobacco: Never Used  . Alcohol use No  . Drug use: No  . Sexual activity: Yes    Birth control/ protection: None   Other Topics Concern  . None   Social History Narrative  . None    Family History  Problem Relation Age of Onset  . Diabetes Mother   . Diabetes Father     No Known Allergies  Prescriptions Prior to Admission  Medication Sig Dispense Refill Last Dose  . Prenatal Multivit-Min-Fe-FA (PRENATAL VITAMINS) 0.8 MG tablet Take 1 tablet by mouth daily. 30 tablet 12 11/21/2016 at Unknown time    Review of Systems - pt has a new onset of proteinuria noted on her ofc visit 4/19.    h/o uterine didelphis   Vitals:  BP (!) 146/91   Pulse 98   Temp 98.9 F (37.2 C) (Oral)   Resp 18   Ht 5\' 4"  (1.626 m)   Wt 182 lb (82.6 kg)  LMP  (LMP Unknown)   SpO2 99%   BMI 31.24 kg/m  Physical Examination: GENERAL: Well-developed, well-nourished female in no acute distress.  LUNGS: Clear to auscultation bilaterally.  HEART: Regular rate and rhythm. ABDOMEN: Soft,diffusely tender with superpubic tenderness noted; nondistended. No organomegaly. Back: Left CVAT EXTREMITIES: No cyanosis, clubbing, or edema, 2+ distal pulses with DTRs 1+ bilaterally CERVIX: eval by CNM Membranes:intact Fetal Monitoring:Baseline: 130's bpm, Variability: Good {> 6 bpm), Accelerations: Reactive, Decelerations: Absent and Toco: no contractions  Tocometer: Flat  Labs:  Results for orders placed or performed during the hospital encounter of 11/22/16 (from the past 24 hour(s))  Urinalysis, Routine w reflex microscopic   Collection Time: 11/22/16  4:20 PM  Result Value Ref Range   Color, Urine  RED (A) YELLOW   APPearance CLOUDY (A) CLEAR   Specific Gravity, Urine 1.013 1.005 - 1.030   pH 6.0 5.0 - 8.0   Glucose, UA 50 (A) NEGATIVE mg/dL   Hgb urine dipstick LARGE (A) NEGATIVE   Bilirubin Urine NEGATIVE NEGATIVE   Ketones, ur NEGATIVE NEGATIVE mg/dL   Protein, ur 161 (A) NEGATIVE mg/dL   Nitrite NEGATIVE NEGATIVE   Leukocytes, UA TRACE (A) NEGATIVE   RBC / HPF TOO NUMEROUS TO COUNT 0 - 5 RBC/hpf   WBC, UA TOO NUMEROUS TO COUNT 0 - 5 WBC/hpf   Bacteria, UA NONE SEEN NONE SEEN   Squamous Epithelial / LPF TOO NUMEROUS TO COUNT (A) NONE SEEN  CBC with Differential/Platelet   Collection Time: 11/22/16  5:12 PM  Result Value Ref Range   WBC 14.1 (H) 4.0 - 10.5 K/uL   RBC 3.74 (L) 3.87 - 5.11 MIL/uL   Hemoglobin 11.6 (L) 12.0 - 15.0 g/dL   HCT 09.6 (L) 04.5 - 40.9 %   MCV 91.7 78.0 - 100.0 fL   MCH 31.0 26.0 - 34.0 pg   MCHC 33.8 30.0 - 36.0 g/dL   RDW 81.1 91.4 - 78.2 %   Platelets 195 150 - 400 K/uL   Neutrophils Relative % 82 %   Neutro Abs 11.5 (H) 1.7 - 7.7 K/uL   Lymphocytes Relative 13 %   Lymphs Abs 1.8 0.7 - 4.0 K/uL   Monocytes Relative 5 %   Monocytes Absolute 0.7 0.1 - 1.0 K/uL   Eosinophils Relative 0 %   Eosinophils Absolute 0.0 0.0 - 0.7 K/uL   Basophils Relative 0 %   Basophils Absolute 0.0 0.0 - 0.1 K/uL  Comprehensive metabolic panel   Collection Time: 11/22/16  5:12 PM  Result Value Ref Range   Sodium 135 135 - 145 mmol/L   Potassium 3.9 3.5 - 5.1 mmol/L   Chloride 105 101 - 111 mmol/L   CO2 20 (L) 22 - 32 mmol/L   Glucose, Bld 80 65 - 99 mg/dL   BUN 17 6 - 20 mg/dL   Creatinine, Ser 9.56 (H) 0.44 - 1.00 mg/dL   Calcium 8.4 (L) 8.9 - 10.3 mg/dL   Total Protein 5.9 (L) 6.5 - 8.1 g/dL   Albumin 2.8 (L) 3.5 - 5.0 g/dL   AST 22 15 - 41 U/L   ALT 13 (L) 14 - 54 U/L   Alkaline Phosphatase 87 38 - 126 U/L   Total Bilirubin 0.4 0.3 - 1.2 mg/dL   GFR calc non Af Amer 44 (L) >60 mL/min   GFR calc Af Amer 51 (L) >60 mL/min   Anion gap 10 5 - 15     Imaging Studies: [place] Mfm Ob Follow  Up  Result Date: 11/03/2016 ----------------------------------------------------------------------  OBSTETRICS REPORT                      (Signed Final 11/03/2016 03:48 pm) ---------------------------------------------------------------------- Patient Info  ID #:       [id number]                         D.O.B.:   05/02/1996 (21 yrs)  Name:       Wende Crease                Visit Date:  11/03/2016 02:36 pm              MORAN ---------------------------------------------------------------------- Performed By  Performed By:     Tommie Raymond BS,       Secondary Phy.:   [facility]                    RDMS, RVT                                                             [facility]  Attending:        Erle Crocker MD     Address:          [facility]                                                             [address]                                                             [address]  [place], [place]                                                             [address]  Referred By:      Jethro Bastos               Location:         [facility] MD  Ref. Address:     [address]                    [place], [place]                    [address] ---------------------------------------------------------------------- Orders   #  Description                                 Code   1  [place] MFM OB FOLLOW UP                         86578.46  ----------------------------------------------------------------------   #  Ordered By               Order #        Accession #    Episode #   1  Alpha Gula            [id number]      [id number]     [id number]   ---------------------------------------------------------------------- Indications   [redacted] weeks gestation of pregnancy                Z3A.30   Uterine abnormality during pregnancy           O34.599   (uterus didelphys)   Poor obstetric history: Previous preterm       O09.219   delivery (induced at 34wks due to oligo)   Personal history of congenital abnormality     Z87.798   (Right Renal Agenesis)   Encounter for other antenatal screening        Z36.2   follow-up  ---------------------------------------------------------------------- OB History  Blood Type:            Height:  5'4"   Weight (lb):  158      BMI:   27.12  Gravidity:    2         Term:   1        Prem:   0        SAB:   0  TOP:          0       Ectopic:  0        Living: 1 ---------------------------------------------------------------------- Fetal Evaluation  Num Of Fetuses:     1  Preg. Location:     Intrauterine in Right horn  Fetal Heart         151  Rate(bpm):  Cardiac Activity:   Observed  Presentation:       Breech  Placenta:  Anterior, above cervical os  P. Cord Insertion:  Previously Visualized  Amniotic Fluid  AFI FV:      Subjectively within normal limits  AFI Sum(cm)     %Tile       Largest Pocket(cm)  11.07           22          4.91  RUQ(cm)       RLQ(cm)       LUQ(cm)        LLQ(cm)  2.48          2.15          1.53           4.91 ---------------------------------------------------------------------- Biometry  BPD:      75.5  mm     G. Age:  30w 2d         47  %    CI:        78.78   %   70 - 86                                                          FL/HC:      20.6   %   19.2 - 21.4  HC:       269   mm     G. Age:  29w 2d          7  %    HC/AC:      1.06       0.99 - 1.21  AC:      253.8  mm     G. Age:  29w 4d         32  %    FL/BPD:     73.2   %   71 - 87  FL:       55.3  mm     G. Age:  29w 1d         16  %    FL/AC:      21.8   %   20 - 24  HUM:      49.7  mm     G. Age:  29w 1d         35  %  CER:      35.1  mm     G.  Age:  30w 1d         51  %  CM:        8.8  mm  Est. FW:    1397  gm      3 lb 1 oz     42  % ---------------------------------------------------------------------- Gestational Age  U/S Today:     29w 4d                                        EDD:   01/15/17  Best:          30w 0d    Det. ByMarcella Dubs         EDD:   01/12/17                                      (  06/29/16) ---------------------------------------------------------------------- Anatomy  Cranium:               Appears normal         Aortic Arch:            Previously seen  Cavum:                 Previously seen        Ductal Arch:            Previously seen  Ventricles:            Appears normal         Diaphragm:              Appears normal  Choroid Plexus:        Previously seen        Stomach:                Appears normal, left                                                                        sided  Cerebellum:            Previously seen        Abdomen:                Previously seen  Posterior Fossa:       Previously seen        Abdominal Wall:         Previously seen  Nuchal Fold:           Previously seen        Cord Vessels:           Previously seen  Face:                  Orbits and profile     Kidneys:                Appear normal                         previously seen  Lips:                  Previously seen        Bladder:                Appears normal  Thoracic:              Previously seen        Spine:                  Previously seen  Heart:                 Previously seen        Upper Extremities:      Previously seen  RVOT:                  Previously seen        Lower Extremities:      Previously seen  LVOT:                  Previously seen  Other:  Female gender  previously seen.  5th digit previously seen.  Technically          difficult due to advanced GA and fetal position. ---------------------------------------------------------------------- Cervix Uterus Adnexa  Cervix  Not visualized (advanced GA >29wks)  Uterus   Uterus didelphys.  Left Ovary  Size(cm)     1.92  x    1.99   x  1.26      Vol(ml): 2.5  Within normal limits. No adnexal mass visualized.  Right Ovary  Size(cm)     2.01  x    2.33   x  1.74      Vol(ml): 4.3  Within normal limits. No adnexal mass visualized.  Cul De Sac:   No free fluid seen.  Adnexa:       No abnormality visualized. ---------------------------------------------------------------------- Impression  Indication: 21 yr old G77P0101 at [redacted]w[redacted]d with uterine  didelphys for fetal growth.  Findings:  1. Single intrauterine pregnancy.  2. Estimated fetal weight is in the 42nd%.  3. Anterior placenta without evidence of previa.  4. Normal amniotic fluid volume.  5. The limited anatomy survey is normal. ---------------------------------------------------------------------- Recommendations  1. Appropriate fetal growth.  2. Uterine didelphys:  - recommend serial fetal growth  - recommend preterm labor precautions  3. Previous preterm delivery was induction at 34 weeks due  to presumed PPROM  - recommend preterm labor precautions  4. Normal MSAFP ----------------------------------------------------------------------                Erle Crocker, MD Electronically Signed Final Report   11/03/2016 03:48 pm ----------------------------------------------------------------------  UA: urine bloody and scant amount.  RBC- TNTC, WBC- TNTC  Bladder scan 57cc post cath  Prenatal Transfer Tool  Maternal Diabetes: No Genetic Screening: DSR not determined. Neg ONTD   Maternal Ultrasounds/Referrals: Normal Fetal Ultrasounds or other Referrals:  None Maternal Substance Abuse:  No Significant Maternal Medications:  None Significant Maternal Lab Results: Lab values include: Other: Proteinuria noted on her last visit   Assessment and Plan: Patient Active Problem List   Diagnosis Date Noted  . Pyelonephritis affecting pregnancy 11/22/2016  . Proteinuria affecting pregnancy in third trimester 10/31/2016  .  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 Supervision of high-risk pregnancy 07/13/2016  . History of preterm delivery, currently pregnant 07/13/2016  . 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 Language barrier 03/02/2014  Pt with Left CVAT, hematuria, N/V and low grade temp which would suggest pyelonephritis. If this is the case pts elevated Cr could be due to dehydration (pre-renal).  She also has new onset elevated BPs and proteinuria (noted first 4/19)- need to recheck BP in 6 hours.  Will follow. I would not suspect CVAT and suprapubic tenderness with oliguria due to preeclampsia.     Admit to Antenatal Betamethasone x 2 doses Ceftriaxone 2 grams q 24 hours Pr:Cr ratio with next urine STRICT I's and O's Watch BP's closely Repeat CMP in 4 hours. Need to recheck Cr after fluids IVF bolus x 2 L then 150cc/hour OB US.  Check AFI and growth     Will recheck presentation if she does progress in preterm labor to determine route of delivery Routine antenatal care  Hamzah Savoca L. Harraway-Smith, M.D., [facility]

## 2016-11-22 NOTE — Progress Notes (Addendum)
Patient ID: Carol [name], female   DOB: 05/17/1996, 21 y.o.   MRN: [id number] Pt denies any pain at present. She reports resolution of N/V.  She initially reported pain that decreased from 10/10 and after a UO of 200cc she reported pain at 5/10. and now, pt passed another ~800cc of urine and she reports no pain at all.    O Pt in NAD Back: no CVAT  Cr 2.1  A Cr still elevated but oliguria resolved.  ?ureteral stone vs kinked ureter.  Unsure the etiology of pts elevated cr but, given the resolution of the oliguria I suspect the Cr will soon improve. Will repeat level in ~6 hours.    P Keep Ceftriaxone Cont to watch strict I's and O's CMP and CBC in the am Cont to follow BPs  Appreciate Nephrology, Urology and Radiology input in the care of this patient  Exam completed with the help of Spanish interpreter, Raquel.    Raykwon Hobbs L. Harraway-Smith, M.D., [facility]

## 2016-11-22 NOTE — MAU Provider Note (Signed)
History   G2P0101 @ 32.5 wks in with nausea and vomiting and diarrhea since yesterday. States diarrhea has subsided but continues to have abd cramping and nausea. Denies ROM or vag bleeding.  CSN: [id number]  Arrival date & time 11/22/16  1611   None     Chief Complaint  Patient presents with  . Nausea  . Emesis  . Diarrhea  . Back Pain  . Abdominal Pain    HPI  Past Medical History:  Diagnosis Date  . 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 NO PAST SURGERIES      Family History  Problem Relation Age of Onset  . Diabetes Mother   . Diabetes Father     Social History  Substance Use Topics  . Smoking status: Never Smoker  . Smokeless tobacco: Never Used  . Alcohol use No    OB History    Gravida Para Term Preterm AB Living   SAB TAB Ectopic Multiple Live Births           1      Review of Systems  Constitutional: Negative.   HENT: Negative.   Eyes: Negative.   Respiratory: Negative.   Cardiovascular: Negative.   Gastrointestinal: Positive for abdominal pain, diarrhea, nausea and vomiting.  Endocrine: Negative.   Genitourinary: Negative.   Musculoskeletal: Negative.   Skin: Negative.   Neurological: Negative.   Hematological: Negative.   Psychiatric/Behavioral: Negative.     Allergies  Patient has no known allergies.  Home Medications    BP (!) 148/93   Pulse (!) 116   Temp 99.1 F (37.3 C)   Resp 18   Ht  (1.626 m)   Wt 182 lb (82.6 kg)   LMP  (LMP Unknown)   BMI 31.24 kg/m   Physical Exam  Constitutional: She is oriented to person, place, and time. She appears well-developed and well-nourished.  HENT:  Head: Normocephalic.  Eyes: Pupils are equal, round, and reactive to light.  Neck: Normal range of motion.  Cardiovascular:  Normal rate, regular rhythm, normal heart sounds and intact distal pulses.   Pulmonary/Chest: Effort normal and breath sounds normal.  Abdominal: Soft. Bowel sounds are normal.  Genitourinary: Vagina normal and uterus normal.  Musculoskeletal: Normal range of motion.  Neurological: She is alert and oriented to person, place, and time. She has normal reflexes.  Skin: Skin is warm and dry.  Psychiatric: She has a normal mood and affect. Her behavior is normal. Judgment and thought content normal.    MAU Course  Procedures (including critical care time)  Labs Reviewed  URINALYSIS, ROUTINE W REFLEX MICROSCOPIC - Abnormal; Notable for the following:       Result Value   Color, Urine RED (*)    APPearance CLOUDY (*)    Glucose, UA 50 (*)    Hgb urine dipstick LARGE (*)    Protein, ur 100 (*)    Leukocytes, UA TRACE (*)    Squamous Epithelial / LPF TOO NUMEROUS TO COUNT (*)    All other components within normal limits   No results found.   1. Congenital renal agenesis, unilateral   2. History of preterm delivery, currently pregnant   3. 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 FHR pattern reassuring. No uc's. BP elevated, will get PIH labs. Creatinine elevated. Dr. Erin Fulling called to evaluate pt.

## 2016-11-23 DIAGNOSIS — O2303 Infections of kidney in pregnancy, third trimester: Principal | ICD-10-CM

## 2016-11-23 LAB — URINALYSIS, ROUTINE W REFLEX MICROSCOPIC
BILIRUBIN URINE: NEGATIVE
Glucose, UA: NEGATIVE mg/dL
Ketones, ur: 5 mg/dL — AB
NITRITE: NEGATIVE
PH: 7 (ref 5.0–8.0)
Protein, ur: NEGATIVE mg/dL
SPECIFIC GRAVITY, URINE: 1.004 — AB (ref 1.005–1.030)

## 2016-11-23 LAB — BASIC METABOLIC PANEL
Anion gap: 6 (ref 5–15)
BUN: 14 mg/dL (ref 6–20)
CHLORIDE: 107 mmol/L (ref 101–111)
CO2: 22 mmol/L (ref 22–32)
CREATININE: 0.64 mg/dL (ref 0.44–1.00)
Calcium: 8.4 mg/dL — ABNORMAL LOW (ref 8.9–10.3)
GFR calc non Af Amer: >60 mL/min (ref >60)
Glucose, Bld: 120 mg/dL — ABNORMAL HIGH (ref 65–99)
POTASSIUM: 3.7 mmol/L (ref 3.5–5.1)
SODIUM: 135 mmol/L (ref 135–145)

## 2016-11-23 LAB — URINE CULTURE: CULTURE: NO GROWTH

## 2016-11-23 LAB — COMPREHENSIVE METABOLIC PANEL
ALK PHOS: 80 U/L (ref 38–126)
ALT: 13 U/L — AB (ref 14–54)
AST: 22 U/L (ref 15–41)
Albumin: 2.8 g/dL — ABNORMAL LOW (ref 3.5–5.0)
Anion gap: 9 (ref 5–15)
BILIRUBIN TOTAL: 0.5 mg/dL (ref 0.3–1.2)
BUN: 14 mg/dL (ref 6–20)
CALCIUM: 8.5 mg/dL — AB (ref 8.9–10.3)
CO2: 22 mmol/L (ref 22–32)
CREATININE: 1.01 mg/dL — AB (ref 0.44–1.00)
Chloride: 107 mmol/L (ref 101–111)
GFR calc Af Amer: >60 mL/min (ref >60)
Glucose, Bld: 147 mg/dL — ABNORMAL HIGH (ref 65–99)
Potassium: 4.2 mmol/L (ref 3.5–5.1)
Sodium: 138 mmol/L (ref 135–145)
TOTAL PROTEIN: 6.4 g/dL — AB (ref 6.5–8.1)

## 2016-11-23 LAB — CBC
HCT: 33.2 % — ABNORMAL LOW (ref 36.0–46.0)
Hemoglobin: 11.4 g/dL — ABNORMAL LOW (ref 12.0–15.0)
MCH: 31.1 pg (ref 26.0–34.0)
MCHC: 34.3 g/dL (ref 30.0–36.0)
MCV: 90.7 fL (ref 78.0–100.0)
PLATELETS: 195 10*3/uL (ref 150–400)
RBC: 3.66 MIL/uL — ABNORMAL LOW (ref 3.87–5.11)
RDW: 13.1 % (ref 11.5–15.5)
WBC: 9.6 10*3/uL (ref 4.0–10.5)

## 2016-11-23 LAB — PROTEIN, URINE, 24 HOUR
Collection Interval-UPROT: 24 hours
Protein, Urine: <6 mg/dL
URINE TOTAL VOLUME-UPROT: 8625 mL

## 2016-11-23 NOTE — Progress Notes (Signed)
Pt called RN to room for kidney stone that was passed while using the bathroom. Kidney stone placed in specimen cup and MD notified.

## 2016-11-23 NOTE — Consult Note (Signed)
[facility] Renal Consultation Note  Requesting MD: Ihor Dow Indication for Consultation: AKI in pregnancy also solitary kidney  HPI:  Carol [name] is a 21 y.o. female with right renal agenesis but otherwise healthy - has completed a pregnancy previously.  She is now in her second pregnancy- at 32 weeks and had been doing well.  Kidney function wise creatinine 0.3 to 0.4. She presented on 4/29 with complaints of flank pain, nausea/vomiting.  U/A 3 weeks ago was completely negative- no blood or protein. U/A upon presentation showed TNTC RBC and WBC and creatinine was noted to quadruple in a 24 hour period with oliguria.  Imaging did not reveal overt obstruction of her solitary kidney but urology was poised to place a stent when suddenly her flank pain completely resolved and UOP picked up.  There are no nephrotoxic meds in her history and her blood pressure was never abnormally low.  She has now made 5600 of urine overnight- repeat creatinine is pending- is now 1.0 !.  She has been started on treatment with rocephin  Creatinine, Ser  Date/Time Value Ref Range Status  11/23/2016 05:53 AM 1.01 (H) 0.44 - 1.00 mg/dL Final    Comment:    REPEATED TO VERIFY DELTA CHECK NOTED   11/22/2016 10:20 PM 2.11 (H) 0.44 - 1.00 mg/dL Final  11/22/2016 07:58 PM 2.06 (H) 0.44 - 1.00 mg/dL Final  11/22/2016 05:12 PM 1.62 (H) 0.44 - 1.00 mg/dL Final  11/12/2016 09:12 AM 0.48 (L) 0.57 - 1.00 mg/dL Final  10/29/2016 08:09 AM 0.33 (L) 0.57 - 1.00 mg/dL Final  05/19/2014 08:55 AM 0.41 (L) 0.50 - 1.10 mg/dL Final     PMHx:   Past Medical History:  Diagnosis Date  . 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 NO PAST SURGERIES      Family Hx:  Family History  Problem Relation Age of Onset  .  Diabetes Mother   . Diabetes Father     Social History:  reports that she has never smoked. She has never used smokeless tobacco. She reports that she does not drink alcohol or use drugs.  Allergies: No Known Allergies  Medications: Prior to Admission medications   Medication Sig Start Date End Date Taking? Authorizing Provider  Prenatal Multivit-Min-Fe-FA (PRENATAL VITAMINS) 0.8 MG tablet Take 1 tablet by mouth daily. 07/13/16  Yes Osborne Oman, MD    I have reviewed the patient's current medications.  Labs:  Results for orders placed or performed during the hospital encounter of 11/22/16 (from the past 48 hour(s))  Urinalysis, Routine w reflex microscopic     Status: Abnormal   Collection Time: 11/22/16  4:20 PM  Result Value Ref Range   Color, Urine RED (A) YELLOW   APPearance CLOUDY (A) CLEAR   Specific Gravity, Urine 1.013 1.005 - 1.030   pH 6.0 5.0 - 8.0   Glucose, UA 50 (A) NEGATIVE mg/dL   Hgb urine dipstick LARGE (A) NEGATIVE   Bilirubin Urine NEGATIVE NEGATIVE   Ketones, ur NEGATIVE NEGATIVE mg/dL   Protein, ur 100 (A) NEGATIVE mg/dL   Nitrite NEGATIVE NEGATIVE   Leukocytes, UA TRACE (A) NEGATIVE   RBC / HPF TOO NUMEROUS TO COUNT 0 - 5 RBC/hpf   WBC, UA TOO NUMEROUS TO COUNT 0 - 5 WBC/hpf  Bacteria, UA NONE SEEN NONE SEEN   Squamous Epithelial / LPF TOO NUMEROUS TO COUNT (A) NONE SEEN  CBC with Differential/Platelet     Status: Abnormal   Collection Time: 11/22/16  5:12 PM  Result Value Ref Range   WBC 14.1 (H) 4.0 - 10.5 K/uL   RBC 3.74 (L) 3.87 - 5.11 MIL/uL   Hemoglobin 11.6 (L) 12.0 - 15.0 g/dL   HCT 34.3 (L) 36.0 - 46.0 %   MCV 91.7 78.0 - 100.0 fL   MCH 31.0 26.0 - 34.0 pg   MCHC 33.8 30.0 - 36.0 g/dL   RDW 12.8 11.5 - 15.5 %   Platelets 195 150 - 400 K/uL   Neutrophils Relative % 82 %   Neutro Abs 11.5 (H) 1.7 - 7.7 K/uL   Lymphocytes Relative 13 %   Lymphs Abs 1.8 0.7 - 4.0 K/uL   Monocytes Relative 5 %   Monocytes Absolute 0.7 0.1 - 1.0 K/uL    Eosinophils Relative 0 %   Eosinophils Absolute 0.0 0.0 - 0.7 K/uL   Basophils Relative 0 %   Basophils Absolute 0.0 0.0 - 0.1 K/uL  Comprehensive metabolic panel     Status: Abnormal   Collection Time: 11/22/16  5:12 PM  Result Value Ref Range   Sodium 135 135 - 145 mmol/L   Potassium 3.9 3.5 - 5.1 mmol/L   Chloride 105 101 - 111 mmol/L   CO2 20 (L) 22 - 32 mmol/L   Glucose, Bld 80 65 - 99 mg/dL   BUN 17 6 - 20 mg/dL   Creatinine, Ser 1.62 (H) 0.44 - 1.00 mg/dL   Calcium 8.4 (L) 8.9 - 10.3 mg/dL   Total Protein 5.9 (L) 6.5 - 8.1 g/dL   Albumin 2.8 (L) 3.5 - 5.0 g/dL   AST 22 15 - 41 U/L   ALT 13 (L) 14 - 54 U/L   Alkaline Phosphatase 87 38 - 126 U/L   Total Bilirubin 0.4 0.3 - 1.2 mg/dL   GFR calc non Af Amer 44 (L) >60 mL/min   GFR calc Af Amer 51 (L) >60 mL/min    Comment: (NOTE) The eGFR has been calculated using the CKD EPI equation. This calculation has not been validated in all clinical situations. eGFR's persistently <60 mL/min signify possible Chronic Kidney Disease.    Anion gap 10 5 - 15  Type and screen [place]     Status: None   Collection Time: 11/22/16  7:58 PM  Result Value Ref Range   ABO/RH(D) O POS    Antibody Screen NEG    Sample Expiration 11/25/2016   Comprehensive metabolic panel     Status: Abnormal   Collection Time: 11/22/16  7:58 PM  Result Value Ref Range   Sodium 135 135 - 145 mmol/L   Potassium 3.9 3.5 - 5.1 mmol/L   Chloride 105 101 - 111 mmol/L   CO2 21 (L) 22 - 32 mmol/L   Glucose, Bld 79 65 - 99 mg/dL   BUN 17 6 - 20 mg/dL   Creatinine, Ser 2.06 (H) 0.44 - 1.00 mg/dL   Calcium 7.9 (L) 8.9 - 10.3 mg/dL   Total Protein 5.3 (L) 6.5 - 8.1 g/dL   Albumin 2.5 (L) 3.5 - 5.0 g/dL   AST 24 15 - 41 U/L   ALT 13 (L) 14 - 54 U/L   Alkaline Phosphatase 80 38 - 126 U/L   Total Bilirubin 0.4 0.3 - 1.2 mg/dL   GFR  calc non Af Amer 33 (L) >60 mL/min   GFR calc Af Amer 38 (L) >60 mL/min    Comment: (NOTE) The eGFR has  been calculated using the CKD EPI equation. This calculation has not been validated in all clinical situations. eGFR's persistently <60 mL/min signify possible Chronic Kidney Disease.    Anion gap 9 5 - 15  Protein / creatinine ratio, urine     Status: Abnormal   Collection Time: 11/22/16  9:00 PM  Result Value Ref Range   Creatinine, Urine 184.00 mg/dL   Total Protein, Urine 170 mg/dL    Comment: RESULTS CONFIRMED BY MANUAL DILUTION NO NORMAL RANGE ESTABLISHED FOR THIS TEST    Protein Creatinine Ratio 0.92 (H) 0.00 - 0.15 mg/mg[Cre]  Creatinine, serum     Status: Abnormal   Collection Time: 11/22/16 10:20 PM  Result Value Ref Range   Creatinine, Ser 2.11 (H) 0.44 - 1.00 mg/dL   GFR calc non Af Amer 32 (L) >60 mL/min   GFR calc Af Amer 37 (L) >60 mL/min    Comment: (NOTE) The eGFR has been calculated using the CKD EPI equation. This calculation has not been validated in all clinical situations. eGFR's persistently <60 mL/min signify possible Chronic Kidney Disease.   CBC     Status: Abnormal   Collection Time: 11/23/16  5:53 AM  Result Value Ref Range   WBC 9.6 4.0 - 10.5 K/uL   RBC 3.66 (L) 3.87 - 5.11 MIL/uL   Hemoglobin 11.4 (L) 12.0 - 15.0 g/dL   HCT 33.2 (L) 36.0 - 46.0 %   MCV 90.7 78.0 - 100.0 fL   MCH 31.1 26.0 - 34.0 pg   MCHC 34.3 30.0 - 36.0 g/dL   RDW 13.1 11.5 - 15.5 %   Platelets 195 150 - 400 K/uL     ROS:  Pertinent items are noted in HPI. At this time no pain,no distress- no edema  Physical Exam: Vitals:   11/23/16 0405 11/23/16 0406  BP:  116/77  Pulse: 71 (!) 114  Resp:  17  Temp:  99.4 F (37.4 C)     General: NAD, pleasant HEENT: PERRLA, EOMI- mucous membranes moist Neck: no JVD Heart: now tachy Lungs: clear Abdomen: gravid uterus- no pain Extremities: no edema Skin: warm and dry Neuro: alert, non focal   Assessment/Plan: 21 year old hispanic female with right renal agenesis presenting with flank pain, gross hematuria/oliguria and  AKI  1.Renal- from the history sounded very much like she had obstructed her solitary functioning kidney either with stone or uterine compression (less likely) however the possibility of some kind of intrarenal process has not been ruled out.  She is now acting like either a post obstructive of post injury diuresis.  I will send off repeat U/A , if cellularity has cleared I think would make intrarenal process much less likely especially since renal function getting better 2. Hypertension/volume  - initially hypertensive when having discomfort and no UOP- since making urine- BP has come back down to normal.  Need to be careful of volume depletion in the setting of polyuria- OK to use IV fluids as needed 3. ID- started on rocephin for possible UTI- culture pending  Thank you for consult.  Thankfully her AKI is resolving - as above sounds like she had transient obstruction of her solitary kidney.  If u/a today is negative and kidney function continues to improve I think intrarenal process can be ruled out.  Renal will follow at a  distance-please call if you have specific questions    Nikiyah Fackler A 11/23/2016, 7:04 AM  Pager [phone]

## 2016-11-23 NOTE — Progress Notes (Signed)
Interpretor used to discuss pt's plan of care.

## 2016-11-23 NOTE — Progress Notes (Signed)
Patient ID: Carol [name], female   DOB: Jun 17, 1996, 21 y.o.   MRN: [id number] ACULT[facility] PRACTICE ANTEPARTUM COMPREHENSIVE PROGRESS NOTE  Carol [name] is a 21 y.o. G2P0101 at [redacted]w[redacted]d  who is admitted for hematuria and oliguria with mildly elevated BPs.   Fetal presentation is breech. Length of Stay:  1  Days  Subjective: Pt reports that all of her pain has resolved.  She denies n/v/f/c Patient reports good fetal movement.  She reports no uterine contractions, no bleeding and no loss of fluid per vagina.  Vitals:  Blood pressure 116/77, pulse (!) 114, temperature 99.4 F (37.4 C), temperature source Oral, resp. rate 17, height  (1.626 m), weight 182 lb (82.6 kg), SpO2 90 %. Physical Examination: General appearance - alert, well appearing, and in no distress Chest - clear to auscultation, no wheezes, rales or rhonchi, symmetric air entry Heart - normal rate, regular rhythm, normal S1, S2, no murmurs, rubs, clicks or gallops Abdomen - soft, nontender, nondistended, no masses or organomegaly gravid Cervical Exam: Not evaluated.  Extremities: extremities normal, atraumatic, no cyanosis or edema  Membranes:intact  Fetal Monitoring:  Baseline: 130's bpm, Variability: Good {> 6 bpm), Accelerations: Reactive, Decelerations: Absent and Toco: non contractions  Labs:  Results for orders placed or performed during the hospital encounter of 11/22/16 (from the past 24 hour(s))  Urinalysis, Routine w reflex microscopic   Collection Time: 11/22/16  4:20 PM  Result Value Ref Range   Color, Urine RED (A) YELLOW   APPearance CLOUDY (A) CLEAR   Specific Gravity, Urine 1.013 1.005 - 1.030   pH 6.0 5.0 - 8.0   Glucose, UA 50 (A) NEGATIVE mg/dL   Hgb urine dipstick LARGE (A) NEGATIVE   Bilirubin Urine NEGATIVE NEGATIVE   Ketones, ur NEGATIVE NEGATIVE mg/dL   Protein, ur 409 (A) NEGATIVE mg/dL   Nitrite NEGATIVE NEGATIVE   Leukocytes, UA TRACE (A) NEGATIVE   RBC / HPF TOO NUMEROUS TO  COUNT 0 - 5 RBC/hpf   WBC, UA TOO NUMEROUS TO COUNT 0 - 5 WBC/hpf   Bacteria, UA NONE SEEN NONE SEEN   Squamous Epithelial / LPF TOO NUMEROUS TO COUNT (A) NONE SEEN  CBC with Differential/Platelet   Collection Time: 11/22/16  5:12 PM  Result Value Ref Range   WBC 14.1 (H) 4.0 - 10.5 K/uL   RBC 3.74 (L) 3.87 - 5.11 MIL/uL   Hemoglobin 11.6 (L) 12.0 - 15.0 g/dL   HCT 81.1 (L) 91.4 - 78.2 %   MCV 91.7 78.0 - 100.0 fL   MCH 31.0 26.0 - 34.0 pg   MCHC 33.8 30.0 - 36.0 g/dL   RDW 95.6 21.3 - 08.6 %   Platelets 195 150 - 400 K/uL   Neutrophils Relative % 82 %   Neutro Abs 11.5 (H) 1.7 - 7.7 K/uL   Lymphocytes Relative 13 %   Lymphs Abs 1.8 0.7 - 4.0 K/uL   Monocytes Relative 5 %   Monocytes Absolute 0.7 0.1 - 1.0 K/uL   Eosinophils Relative 0 %   Eosinophils Absolute 0.0 0.0 - 0.7 K/uL   Basophils Relative 0 %   Basophils Absolute 0.0 0.0 - 0.1 K/uL  Comprehensive metabolic panel   Collection Time: 11/22/16  5:12 PM  Result Value Ref Range   Sodium 135 135 - 145 mmol/L   Potassium 3.9 3.5 - 5.1 mmol/L   Chloride 105 101 - 111 mmol/L   CO2 20 (L) 22 - 32 mmol/L   Glucose, Bld 80  65 - 99 mg/dL   BUN 17 6 - 20 mg/dL   Creatinine, Ser 1.61 (H) 0.44 - 1.00 mg/dL   Calcium 8.4 (L) 8.9 - 10.3 mg/dL   Total Protein 5.9 (L) 6.5 - 8.1 g/dL   Albumin 2.8 (L) 3.5 - 5.0 g/dL   AST 22 15 - 41 U/L   ALT 13 (L) 14 - 54 U/L   Alkaline Phosphatase 87 38 - 126 U/L   Total Bilirubin 0.4 0.3 - 1.2 mg/dL   GFR calc non Af Amer 44 (L) >60 mL/min   GFR calc Af Amer 51 (L) >60 mL/min   Anion gap 10 5 - 15  Comprehensive metabolic panel   Collection Time: 11/22/16  7:58 PM  Result Value Ref Range   Sodium 135 135 - 145 mmol/L   Potassium 3.9 3.5 - 5.1 mmol/L   Chloride 105 101 - 111 mmol/L   CO2 21 (L) 22 - 32 mmol/L   Glucose, Bld 79 65 - 99 mg/dL   BUN 17 6 - 20 mg/dL   Creatinine, Ser 0.96 (H) 0.44 - 1.00 mg/dL   Calcium 7.9 (L) 8.9 - 10.3 mg/dL   Total Protein 5.3 (L) 6.5 - 8.1 g/dL    Albumin 2.5 (L) 3.5 - 5.0 g/dL   AST 24 15 - 41 U/L   ALT 13 (L) 14 - 54 U/L   Alkaline Phosphatase 80 38 - 126 U/L   Total Bilirubin 0.4 0.3 - 1.2 mg/dL   GFR calc non Af Amer 33 (L) >60 mL/min   GFR calc Af Amer 38 (L) >60 mL/min   Anion gap 9 5 - 15  Type and screen [facility]   Collection Time: 11/22/16  7:58 PM  Result Value Ref Range   ABO/RH(D) O POS    Antibody Screen NEG    Sample Expiration 11/25/2016   Protein / creatinine ratio, urine   Collection Time: 11/22/16  9:00 PM  Result Value Ref Range   Creatinine, Urine 184.00 mg/dL   Total Protein, Urine 170 mg/dL   Protein Creatinine Ratio 0.92 (H) 0.00 - 0.15 mg/mg[Cre]  Creatinine, serum   Collection Time: 11/22/16 10:20 PM  Result Value Ref Range   Creatinine, Ser 2.11 (H) 0.44 - 1.00 mg/dL   GFR calc non Af Amer 32 (L) >60 mL/min   GFR calc Af Amer 37 (L) >60 mL/min    Imaging Studies:    Renal US 4/29 mild hydronephrosis  OB US  4/29 Impression  Single IUP at 32w 5d  Gestational hypertension, uterus didelphys  Limited ultrasound performed  Breech presentation  Anterior placenta without previa  Normal amniotic fluid volume  Medications:  Scheduled . betamethasone acetate-betamethasone sodium phosphate  12 mg Intramuscular Q24 Hr x 2  . docusate sodium  100 mg Oral Daily  . prenatal multivitamin  1 tablet Oral Q1200   I have reviewed the patient's current medications.  ASSESSMENT: Patient Active Problem List   Diagnosis Date Noted  . Pyelonephritis affecting pregnancy 11/22/2016  . Proteinuria affecting pregnancy in third trimester 10/31/2016  . 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 Supervision of high-risk pregnancy 07/13/2016  . History of preterm delivery, currently pregnant 07/13/2016  . 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 Language barrier 03/02/2014  Suspect obstruction of some type. Possibly a renal stone.   Sx resolved at present and Cr- is improving.  PLAN: Cont Rocephin for now f/u urine cx  Complete 24 hour urine for protein If cont asymptomatic later tonight after 24 hour urein completed, may discharge to home Repeat UA pending BMP tonight at 6pm Continue routine antenatal care.  Thank you for Urology and nephrology consult.   Edgard Debord Harraway-Smith 11/23/2016,6:16 AM

## 2016-11-24 DIAGNOSIS — N200 Calculus of kidney: Secondary | ICD-10-CM

## 2016-11-24 DIAGNOSIS — O3403 Maternal care for unspecified congenital malformation of uterus, third trimester: Secondary | ICD-10-CM

## 2016-11-24 DIAGNOSIS — O09213 Supervision of pregnancy with history of pre-term labor, third trimester: Secondary | ICD-10-CM

## 2016-11-24 NOTE — Discharge Instructions (Signed)
Clculos renales (Kidney Stones) Los clculos renales (urolitiasis) son masas slidas que se forman en el interior de los riones. El dolor intenso es causado por el movimiento de la piedra a travs del rin, urter, vejiga y Advertising account executive (tracto urinario). Cuando la piedra se mueve, el urter hace un espasmo alrededor de la misma. El clculo generalmente se elimina con el pis (la [place]). CUIDADOS EN EL HOGAR  Debe ingerir gran cantidad de lquido para mantener la orina de tono claro o color amarillo plido. Esto ayudar a eliminar la piedra.  Recoja una muestra de orina durante 24 horas como se lo haya indicado el mdico. Tal vez tenga que recoger otra muestra cada 6 o 12 meses.  Cuele la orina con el colador que le han provisto. No orine de otra forma que no sea a travs del Engineer, manufacturing systems, ni siquiera una vez. Si elimina la piedra, se retendr en el colador. Puede ser tan pequea como un grano de sal. Llvela a su visita con el mdico. Esto ayudar a que el mdico le indique qu puede hacer para tratar de prevenir la ocurrencia de nuevos clculos renales.  Slo tome los medicamentos que le haya indicado su mdico.  Haga cambios en la dieta diaria como se lo haya indicado el mdico. Es posible que le indiquen lo siguiente:  Limitar la cantidad de sal que consume.  Consumir 5 o ms porciones de frutas y Warehouse manager.  Limitar la cantidad de carne, carne de ave, pescado y [employer] consume.  Concurra a todas las visitas de control como se lo haya indicado el mdico. Esto es importante.  Hgase las radiografas segn las indicaciones de su mdico. SOLICITE AYUDA SI: Siente un dolor que empeora an tomando analgsicos. SOLICITE AYUDA DE INMEDIATO SI:  El dolor no mejora con los medicamentos recetados.  Siente escalofros o fiebre.  El dolor aumenta y Insurance underwriter en las siguientes 18 horas.  Siente un nuevo dolor en el vientre (abdominal).  Sufre mareos o se desmaya.  Nota que no puede  orinar. Esta informacin no tiene Theme park manager el consejo del mdico. Asegrese de hacerle al mdico cualquier pregunta que tenga. Document Released: 10/09/2008 Document Revised: 04/03/2015 Document Reviewed: 12/27/2015 Elsevier Interactive Patient Education  2017 [employer].

## 2016-11-24 NOTE — Discharge Summary (Signed)
Antenatal Physician Discharge Summary  Patient ID: Carol [name] MRN: [id number] DOB/AGE: 23-01-1996 23 y.o.  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 G2P0101 with IUP at [redacted]w[redacted]d admitted for severe flank pain, inability to urinate. She was admitted with acute renal failure and presumed obstruction. New onset proteinuria, oliguria and rising creatinine. She was given IVF bolus and a foley catheter and pain improved. Her UOP suddenly picked up and her serum creatinine improved markedly. She then pass 3 stones the next hospital day and Cr continued to improve. Her 24 hour urin protein was undetectable. Her serum Cr went from 2.1-->0.6. She was deemed stable for discharge to home with outpatient follow up.  Discharge Exam: Temp:  [97.4 F (36.3 C)-99 F (37.2 C)] 97.4 F (36.3 C) (05/01 0800) Pulse Rate:  [89-95] 93 (05/01 0800) Resp:  [16-18] 18 (05/01 0800) BP: (97-117)/(49-70) 115/66 (05/01 0800) SpO2:  [95 %-100 %] 100 % (05/01 0800) Physical Examination: CONSTITUTIONAL: Well-developed, well-nourished female in no acute distress.  HENT:  Normocephalic, atraumatic, External right and left ear normal. Oropharynx is clear and moist EYES: Conjunctivae and EOM are normal.No scleral icterus.  NECK: Normal range of motion, supple, no masses SKIN: Skin is warm and dry. No rash noted. Not diaphoretic. No erythema. No pallor. NEUROLGIC: Alert and oriented to person, place, and  time. Normal reflexes, muscle tone coordination. No cranial nerve deficit noted. PSYCHIATRIC: Normal mood and affect. Normal behavior. Normal judgment and thought content. CARDIOVASCULAR: Normal heart rate noted, regular rhythm RESPIRATORY: Effort and breath sounds normal, no problems with respiration noted MUSCULOSKELETAL: Normal range of motion. No edema and no tenderness. 2+ distal pulses. ABDOMEN: Soft, nontender, nondistended, gravid.  Fetal monitoring: FHR: 140 bpm, Variability: moderate, Accelerations: Present, Decelerations: Absent    Significant Diagnostic Studies:  Results for orders placed or performed during the hospital encounter of 11/22/16 (from the past 168 hour(s))  Urinalysis, Routine w reflex microscopic   Collection Time: 11/22/16  4:20 PM  Result Value Ref Range   Color, Urine RED (A) YELLOW   APPearance CLOUDY (A) CLEAR   Specific Gravity, Urine 1.013 1.005 - 1.030   pH 6.0 5.0 - 8.0   Glucose, UA 50 (A) NEGATIVE mg/dL   Hgb urine dipstick LARGE (A) NEGATIVE   Bilirubin Urine NEGATIVE NEGATIVE   Ketones, ur NEGATIVE NEGATIVE mg/dL   Protein, ur 401 (A) NEGATIVE mg/dL   Nitrite NEGATIVE NEGATIVE   Leukocytes, UA TRACE (A) NEGATIVE   RBC / HPF TOO NUMEROUS TO COUNT 0 - 5 RBC/hpf   WBC, UA TOO NUMEROUS TO COUNT 0 - 5 WBC/hpf   Bacteria, UA NONE SEEN NONE SEEN   Squamous Epithelial / LPF TOO NUMEROUS TO COUNT (A) NONE SEEN  CBC with Differential/Platelet   Collection Time: 11/22/16  5:12 PM  Result Value Ref Range   WBC 14.1 (H) 4.0 - 10.5 K/uL   RBC 3.74 (L) 3.87 - 5.11 MIL/uL   Hemoglobin 11.6 (L) 12.0 - 15.0 g/dL   HCT 02.7 (L) 25.3 - 66.4 %   MCV  91.7 78.0 - 100.0 fL   MCH 31.0 26.0 - 34.0 pg   MCHC 33.8 30.0 - 36.0 g/dL   RDW 16.1 09.6 - 04.5 %   Platelets 195 150 - 400 K/uL   Neutrophils Relative % 82 %   Neutro Abs 11.5 (H) 1.7 - 7.7 K/uL   Lymphocytes Relative 13 %   Lymphs Abs 1.8 0.7 - 4.0 K/uL   Monocytes Relative 5 %   Monocytes Absolute  0.7 0.1 - 1.0 K/uL   Eosinophils Relative 0 %   Eosinophils Absolute 0.0 0.0 - 0.7 K/uL   Basophils Relative 0 %   Basophils Absolute 0.0 0.0 - 0.1 K/uL  Comprehensive metabolic panel   Collection Time: 11/22/16  5:12 PM  Result Value Ref Range   Sodium 135 135 - 145 mmol/L   Potassium 3.9 3.5 - 5.1 mmol/L   Chloride 105 101 - 111 mmol/L   CO2 20 (L) 22 - 32 mmol/L   Glucose, Bld 80 65 - 99 mg/dL   BUN 17 6 - 20 mg/dL   Creatinine, Ser 4.09 (H) 0.44 - 1.00 mg/dL   Calcium 8.4 (L) 8.9 - 10.3 mg/dL   Total Protein 5.9 (L) 6.5 - 8.1 g/dL   Albumin 2.8 (L) 3.5 - 5.0 g/dL   AST 22 15 - 41 U/L   ALT 13 (L) 14 - 54 U/L   Alkaline Phosphatase 87 38 - 126 U/L   Total Bilirubin 0.4 0.3 - 1.2 mg/dL   GFR calc non Af Amer 44 (L) >60 mL/min   GFR calc Af Amer 51 (L) >60 mL/min   Anion gap 10 5 - 15  Urine culture   Collection Time: 11/22/16  6:40 PM  Result Value Ref Range   Specimen Description URINE, CATHETERIZED    Special Requests NONE    Culture      NO GROWTH Performed at [facility], [address], [place], [place] [address]    Report Status 11/23/2016 FINAL   Comprehensive metabolic panel   Collection Time: 11/22/16  7:58 PM  Result Value Ref Range   Sodium 135 135 - 145 mmol/L   Potassium 3.9 3.5 - 5.1 mmol/L   Chloride 105 101 - 111 mmol/L   CO2 21 (L) 22 - 32 mmol/L   Glucose, Bld 79 65 - 99 mg/dL   BUN 17 6 - 20 mg/dL   Creatinine, Ser 4.78 (H) 0.44 - 1.00 mg/dL   Calcium 7.9 (L) 8.9 - 10.3 mg/dL   Total Protein 5.3 (L) 6.5 - 8.1 g/dL   Albumin 2.5 (L) 3.5 - 5.0 g/dL   AST 24 15 - 41 U/L   ALT 13 (L) 14 - 54 U/L   Alkaline Phosphatase 80 38 - 126 U/L   Total Bilirubin 0.4 0.3 - 1.2 mg/dL   GFR calc non Af Amer 33 (L) >60 mL/min   GFR calc Af Amer 38 (L) >60 mL/min   Anion gap 9 5 - 15  Type and screen [facility]   Collection Time: 11/22/16  7:58 PM  Result Value Ref Range   ABO/RH(D) O POS    Antibody Screen NEG    Sample  Expiration 11/25/2016   Protein, urine, 24 hour   Collection Time: 11/22/16  9:00 PM  Result Value Ref Range   Urine Total Volume-UPROT 8,625 mL   Collection Interval-UPROT 24 hours   Protein, Urine <6 mg/dL   Protein, 29F Urine  50 - 100 mg/day  Protein / creatinine ratio, urine   Collection Time: 11/22/16  9:00 PM  Result Value Ref Range   Creatinine, Urine 184.00 mg/dL   Total Protein, Urine 170 mg/dL   Protein Creatinine Ratio 0.92 (H) 0.00 - 0.15 mg/mg[Cre]  Creatinine, serum   Collection Time: 11/22/16 10:20 PM  Result Value Ref Range   Creatinine, Ser 2.11 (H) 0.44 - 1.00 mg/dL   GFR calc non Af Amer 32 (L) >60 mL/min   GFR calc Af Amer 37 (L) >60 mL/min  CBC   Collection Time: 11/23/16  5:53 AM  Result Value Ref Range   WBC 9.6 4.0 - 10.5 K/uL   RBC 3.66 (L) 3.87 - 5.11 MIL/uL   Hemoglobin 11.4 (L) 12.0 - 15.0 g/dL   HCT 16.1 (L) 09.6 - 04.5 %   MCV 90.7 78.0 - 100.0 fL   MCH 31.1 26.0 - 34.0 pg   MCHC 34.3 30.0 - 36.0 g/dL   RDW 40.9 81.1 - 91.4 %   Platelets 195 150 - 400 K/uL  Comprehensive metabolic panel   Collection Time: 11/23/16  5:53 AM  Result Value Ref Range   Sodium 138 135 - 145 mmol/L   Potassium 4.2 3.5 - 5.1 mmol/L   Chloride 107 101 - 111 mmol/L   CO2 22 22 - 32 mmol/L   Glucose, Bld 147 (H) 65 - 99 mg/dL   BUN 14 6 - 20 mg/dL   Creatinine, Ser 7.82 (H) 0.44 - 1.00 mg/dL   Calcium 8.5 (L) 8.9 - 10.3 mg/dL   Total Protein 6.4 (L) 6.5 - 8.1 g/dL   Albumin 2.8 (L) 3.5 - 5.0 g/dL   AST 22 15 - 41 U/L   ALT 13 (L) 14 - 54 U/L   Alkaline Phosphatase 80 38 - 126 U/L   Total Bilirubin 0.5 0.3 - 1.2 mg/dL   GFR calc non Af Amer >60 >60 mL/min   GFR calc Af Amer >60 >60 mL/min   Anion gap 9 5 - 15  Urinalysis, Routine w reflex microscopic   Collection Time: 11/23/16  7:40 AM  Result Value Ref Range   Color, Urine STRAW (A) YELLOW   APPearance CLEAR CLEAR   Specific Gravity, Urine 1.004 (L) 1.005 - 1.030   pH 7.0 5.0 - 8.0   Glucose, UA  NEGATIVE NEGATIVE mg/dL   Hgb urine dipstick MODERATE (A) NEGATIVE   Bilirubin Urine NEGATIVE NEGATIVE   Ketones, ur 5 (A) NEGATIVE mg/dL   Protein, ur NEGATIVE NEGATIVE mg/dL   Nitrite NEGATIVE NEGATIVE   Leukocytes, UA TRACE (A) NEGATIVE   RBC / HPF 0-5 0 - 5 RBC/hpf   WBC, UA 0-5 0 - 5 WBC/hpf   Bacteria, UA RARE (A) NONE SEEN   Squamous Epithelial / LPF 0-5 (A) NONE SEEN  Basic metabolic panel   Collection Time: 11/23/16  5:54 PM  Result Value Ref Range   Sodium 135 135 - 145 mmol/L   Potassium 3.7 3.5 - 5.1 mmol/L   Chloride 107 101 - 111 mmol/L   CO2 22 22 - 32 mmol/L   Glucose, Bld 120 (H) 65 - 99 mg/dL   BUN 14 6 - 20 mg/dL   Creatinine, Ser 9.56 0.44 - 1.00 mg/dL   Calcium 8.4 (L) 8.9 - 10.3 mg/dL   GFR calc non Af Amer >60 >60 mL/min   GFR calc Af Amer >60 >60 mL/min   Anion gap 6 5 - 15    Discharge  Condition: Stable  Disposition: 01-Home or Self Care   Discharge Instructions    Discharge activity:  Bathroom / Shower only    Complete by:  As directed    Discharge activity: Bedrest    Complete by:  As directed    Discharge diet:  No restrictions    Complete by:  As directed    Fetal Kick Count:  Lie on our left side for one hour after a meal, and count the number of times your baby kicks.  If it is less than 5 times, get up, move around and drink some juice.  Repeat the test 30 minutes later.  If it is still less than 5 kicks in an hour, notify your doctor.    Complete by:  As directed    No sexual activity restrictions    Complete by:  As directed    Notify physician for a general feeling that "something is not right"    Complete by:  As directed    Notify physician for increase or change in vaginal discharge    Complete by:  As directed    Notify physician for intestinal cramps, with or without diarrhea, sometimes described as "gas pain"    Complete by:  As directed    Notify physician for leaking of fluid    Complete by:  As directed    Notify physician  for low, dull backache, unrelieved by heat or Tylenol    Complete by:  As directed    Notify physician for menstrual like cramps    Complete by:  As directed    Notify physician for pelvic pressure    Complete by:  As directed    Notify physician for uterine contractions.  These may be painless and feel like the uterus is tightening or the baby is  "balling up"    Complete by:  As directed    Notify physician for vaginal bleeding    Complete by:  As directed    PRETERM LABOR:  Includes any of the follwing symptoms that occur between 20 - [redacted] weeks gestation.  If these symptoms are not stopped, preterm labor can result in preterm delivery, placing your baby at risk    Complete by:  As directed      Allergies as of 11/24/2016   No Known Allergies     Medication List    TAKE these medications   Prenatal Vitamins 0.8 MG tablet Take 1 tablet by mouth daily.      Follow-up Information    [facility] Follow up.   Specialty:  Obstetrics and Gynecology Why:  keep next scheduled appointment Contact information: [address] [place] [address] [phone]          Signed: Reva Bores M.D. 11/24/2016, 10:35 AM

## 2016-11-27 ENCOUNTER — Ambulatory Visit (INDEPENDENT_AMBULATORY_CARE_PROVIDER_SITE_OTHER): Payer: Medicaid Other | Admitting: Obstetrics & Gynecology

## 2016-11-27 VITALS — BP 119/73 | HR 87 | Temp 98.5°F | Wt 178.5 lb

## 2016-11-27 DIAGNOSIS — O3403 Maternal care for unspecified congenital malformation of uterus, third trimester: Secondary | ICD-10-CM

## 2016-11-27 DIAGNOSIS — O09899 Supervision of other high risk pregnancies, unspecified trimester: Secondary | ICD-10-CM

## 2016-11-27 DIAGNOSIS — N200 Calculus of kidney: Secondary | ICD-10-CM | POA: Diagnosis not present

## 2016-11-27 DIAGNOSIS — O0993 Supervision of high risk pregnancy, unspecified, third trimester: Secondary | ICD-10-CM

## 2016-11-27 DIAGNOSIS — O09219 Supervision of pregnancy with history of pre-term labor, unspecified trimester: Secondary | ICD-10-CM | POA: Diagnosis not present

## 2016-11-27 LAB — POCT URINALYSIS DIP (DEVICE)
BILIRUBIN URINE: NEGATIVE
Glucose, UA: NEGATIVE mg/dL
HGB URINE DIPSTICK: NEGATIVE
KETONES UR: NEGATIVE mg/dL
LEUKOCYTES UA: NEGATIVE
NITRITE: NEGATIVE
Protein, ur: NEGATIVE mg/dL
Specific Gravity, Urine: 1.01 (ref 1.005–1.030)
Urobilinogen, UA: 0.2 mg/dL (ref 0.0–1.0)
pH: 6.5 (ref 5.0–8.0)

## 2016-11-27 NOTE — Progress Notes (Signed)
   PRENATAL VISIT NOTE  Subjective:  Carol [name] is a 21 y.o. G2P0101 at 5250w3d being seen today for ongoing prenatal care.  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  Patient reports flank pain is improving.  Contractions: Not present. Vag. Bleeding: None.  Movement: Present. Denies leaking of fluid.   The following portions of the patient's history were reviewed and updated as appropriate: allergies, current medications, past family history, past medical history, past social history, past surgical history and problem list. Problem list updated.  Objective:   Vitals:   11/27/16 0949  BP: 119/73  Pulse: 87  Temp: 98.5 F (36.9 C)  Weight: 178 lb 8 oz (81 kg)    Fetal Status: Fetal Heart Rate (bpm): 154   Movement: Present     General:  Alert, oriented and cooperative. Patient is in no acute distress.  Skin: Skin is warm and dry. No rash noted.   Cardiovascular: Normal heart rate noted  Respiratory: Normal respiratory effort, no problems with respiration noted  Abdomen: Soft, gravid, appropriate for gestational age. Pain/Pressure: Absent     Pelvic:  Cervical exam deferred        Extremities: Normal range of motion.  Edema: None  Mental Status: Normal mood and affect. Normal behavior. Normal judgment and thought content.   Assessment and Plan:  Pregnancy: G2P0101 at 2950w3d  1. History of preterm delivery, currently pregnant   2. Supervision of high risk pregnancy in third trimester   3. Congenital abnormality of uterus during pregnancy in third trimester   4. Nephrolithiasis instructions on  diet given  Preterm labor symptoms and general obstetric precautions including but not limited to vaginal bleeding, contractions, leaking of fluid and fetal movement were reviewed in detail with the patient. Please refer to After Visit Summary for other counseling recommendations.  Return in about 2 weeks (around 12/11/2016).   Adam PhenixJames G Fleming Prill, MD

## 2016-11-27 NOTE — Progress Notes (Signed)
Spanish video interpreter "Gio" [phone] used

## 2016-11-27 NOTE — Patient Instructions (Signed)
Recomendaciones dietticas para prevenir la formacin de clculos renales (Dietary Guidelines to Help Prevent Kidney Stones) El riesgo de clculos renales puede disminuir si modifica los alimentos que consume. Lo ms importante es que beba mucho lquido. Debe beber la suficiente cantidad de lquido para mantener la orina clara o de color amarillo plido. Las siguientes recomendaciones brindan informacin especfica para el tipo de clculo renal que usted tiene: RECOMENDACIONES SEGN EL TIPO DE CLCULO RENAL Clculos renales de oxalato de calcio  Reduzca la cantidad de sal que ingiere. Los alimentos con mucha sal hacen que el cuerpo libere exceso de calcio en la orina. El exceso de calcio se puede combinar con una sustancia llamada oxalato y formar clculos renales.  Reduzca la cantidad de protenas animales que consume si lo hace en exceso. Las protenas animales hacen que el cuerpo libere exceso de calcio en la orina. Pregunte a su nutricionista qu cantidad de protenas provenientes de animales debera comer.  Evite los alimentos con alto contenido de oxalatos. Si toma vitaminas, debe consumir menos de 500 mg de vitaminaC. El cuerpo convierte la vitaminaC en oxalatos. No es necesario que evite las frutas y verduras ricas en vitaminaC. Clculos renales de fosfato de calcio  Reduzca la cantidad de sal que ingiere para ayudar a evitar la liberacin de exceso de calcio en la orina.  Reduzca la cantidad de protenas animales que consume si lo hace en exceso. Las protenas animales hacen que el cuerpo libere exceso de calcio en la orina. Pregunte a su nutricionista qu cantidad de protenas provenientes de animales debera comer.  Consuma calcio a travs de los alimentos o tome un suplemento de calcio (pdale recomendaciones a su nutricionista). Ejemplos de fuentes alimenticias de calcio que no aumentan el riesgo de clculos renales:  Brcoli.  Productos lcteos, como queso y  Dentistyogur.  Pudin. Clculos renales de cido rico  No consuma ms de 6 onzas (170 g) de protenas animales por da. FUENTES ALIMENTICIAS Iris PertFuentes de protenas animales  Carne (todo tipo).  Aves.  Huevos.  Pescado, frutos de mar. Alimentos con alto contenido de sal  Condimentos salados.  Salsa de soja.  Salsa teriyaki.  Carnes curadas y procesadas.  Galletas saladas y bocadillos.  Comida rpida.  Sopas enlatadas y Games developerla mayora de los alimentos enlatados. Alimentos con alto contenido de oxalatos  Granos:  Corporate investment bankerAmaranto.  Cebada.  Smola.  Germen de trigo.  Salvado.  Harina de trigo sarraceno.  Todos los cereales de salvado.  Pretzels.  Pan integral.  Verduras:  Frijoles (de vaina amarilla).  Remolachas y hojas de Psychiatric nurseremolacha.  Vincent PeyerBerza.  Christella NoaBerenjena.  Escarola.  Puerro.  Calal.  Perejil.  Colinabos.  Espinaca.  Acelga suiza.  Extracto de Harpertomate.  Papas fritas.  Batatas.  Nils PyleFrutasBenancio Deeds:  Grosellas rojas.  Higos.  Kiwi.  Ruibarbo.  Carne y otras fuentes de protenas:  Frijoles (secos).  Hamburguesas de soja y otros productos de soja.  Miso.  Frutos secos (cacahuetes, almendras, pacanas, castaas, avellanas).  Mantequilla de frutos secos.  Semillas de ssamo y tahini (pasta hecha de semillas de ssamo).  Semillas de amapola.  Bebidas:  Polvo para bebidas de chocolate.  Leche de soja.  T helado instantneo.  Jugos hechos de frutas o verduras con alto contenido de oxalato.  Otros:  Algarroba.  Chocolate.  Torta de frutas.  Mermeladas. Esta informacin no tiene Theme park managercomo fin reemplazar el consejo del mdico. Asegrese de hacerle al mdico cualquier pregunta que tenga. Document Released: 04/06/2012 Document Revised: 07/18/2013 Document Reviewed: 06/09/2013 Elsevier  Interactive Patient Education  2017 [employer].

## 2016-12-01 ENCOUNTER — Encounter (HOSPITAL_COMMUNITY): Payer: Self-pay

## 2016-12-01 ENCOUNTER — Ambulatory Visit (HOSPITAL_COMMUNITY)
Admission: RE | Admit: 2016-12-01 | Discharge: 2016-12-01 | Disposition: A | Discharge status: Home / Self Care | Payer: Medicaid Other | Source: Ambulatory Visit | Attending: Obstetrics & Gynecology | Admitting: Obstetrics & Gynecology

## 2016-12-01 DIAGNOSIS — Q5128 Other doubling of uterus, other specified: Secondary | ICD-10-CM

## 2016-12-01 DIAGNOSIS — O34593 Maternal care for other abnormalities of gravid uterus, third trimester: Secondary | ICD-10-CM | POA: Diagnosis present

## 2016-12-01 DIAGNOSIS — Z3A34 34 weeks gestation of pregnancy: Secondary | ICD-10-CM | POA: Insufficient documentation

## 2016-12-01 DIAGNOSIS — O34592 Maternal care for other abnormalities of gravid uterus, second trimester: Secondary | ICD-10-CM

## 2016-12-08 ENCOUNTER — Encounter: Payer: Self-pay | Admitting: *Deleted

## 2016-12-09 ENCOUNTER — Ambulatory Visit (INDEPENDENT_AMBULATORY_CARE_PROVIDER_SITE_OTHER): Payer: Medicaid Other | Admitting: Obstetrics & Gynecology

## 2016-12-09 VITALS — BP 125/75 | HR 88 | Wt 187.0 lb

## 2016-12-09 DIAGNOSIS — O09219 Supervision of pregnancy with history of pre-term labor, unspecified trimester: Secondary | ICD-10-CM

## 2016-12-09 DIAGNOSIS — O09213 Supervision of pregnancy with history of pre-term labor, third trimester: Secondary | ICD-10-CM

## 2016-12-09 DIAGNOSIS — O0993 Supervision of high risk pregnancy, unspecified, third trimester: Secondary | ICD-10-CM

## 2016-12-09 DIAGNOSIS — O09899 Supervision of other high risk pregnancies, unspecified trimester: Secondary | ICD-10-CM

## 2016-12-09 NOTE — Progress Notes (Signed)
   PRENATAL VISIT NOTE  Subjective:Baby was breech last US  Carol [name] is a 21 y.o. G2P0101 at 138w1d being seen today for ongoing prenatal care.  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  Patient reports occasional contractions.  Contractions: Not present. Vag. Bleeding: None.  Movement: Present. Denies leaking of fluid.   The following portions of the patient's history were reviewed and updated as appropriate: allergies, current medications, past family history, past medical history, past social history, past surgical history and problem list. Problem list updated.  Objective:   Vitals:   12/09/16 1546  BP: 125/75  Pulse: 88  Weight: 187 lb (84.8 kg)    Fetal Status: Fetal Heart Rate (bpm): 139   Movement: Present     General:  Alert, oriented and cooperative. Patient is in no acute distress.  Skin: Skin is warm and dry. No rash noted.   Cardiovascular: Normal heart rate noted  Respiratory: Normal respiratory effort, no problems with respiration noted  Abdomen: Soft, gravid, appropriate for gestational age. Pain/Pressure: Present     Pelvic:  Cervical exam deferred        Extremities: Normal range of motion.  Edema: None  Mental Status: Normal mood and affect. Normal behavior. Normal judgment and thought content.   Assessment and Plan:  Pregnancy: G2P0101 at 228w1d  1. Supervision of high risk pregnancy in third trimester Breech presentation  2. History of preterm delivery, currently pregnant   Preterm labor symptoms and general obstetric precautions including but not limited to vaginal  bleeding, contractions, leaking of fluid and fetal movement were reviewed in detail with the patient. Please refer to After Visit Summary for other counseling recommendations.  Return in about 1 week (around 12/16/2016).   Scheryl DarterJames Karma Ansley, MD

## 2016-12-09 NOTE — Patient Instructions (Signed)
Informacin sobre el parto prematuro  (Preterm Labor Information)  Se llama parto prematuro cuando se inicia antes de las 37 semanas de embarazo. La duracin de un embarazo normal es de 39 a 41 semanas.  CAUSAS  Generalmente las causas del parto prematuro no se conocen. La causa ms frecuente conocida es una infeccin.  FACTORES DE RIESGO   Historia previa de parto prematuro.  Romper la bolsa de aguas antes de tiempo.  La placenta cubre la abertura del cuello.  La placenta se despega del tero.  El cuello es demasiado dbil para contener al beb en el tero.  Hay mucho lquido en el saco amnitico.  Consumo de drogas o hbito de fumar durante el embarazo.  No aumentar de peso lo suficiente durante el embarazo.  Mujeres menores de 18 aos o mayores de 35 aos.  Tener bajos ingresos.  Pertenecer a la raza afroamericana. SNTOMAS   Clicos similares a los menstruales, dolor en el vientre (abdominal) o dolor en la espalda.  Contracciones regulares, tan frecuentes como seis en una hora. Pueden ser suaves o dolorosas.  Contracciones que comienzan en la parte superior del vientre. Luego bajan hacia la zona inferior del vientre y hacia la espalda.  Presin en la zona inferior del vientre que parece empeorar.  Sangrado que proviene de la vagina.  Prdida de lquido por la vagina. TRATAMIENTO  El tratamiento depende de:   Su estado.  El estado del beb.  Cuntas semanas tiene de embarazo. El mdico podr indicarle:   Medicamentos para detener las contracciones.  Que permanezca en la cama excepto para ir al bao (reposo en cama).  Que permanezca en el hospital. QU DEBE HACER SI PIENSA QUE EST EN TRABAJO DE PARTO PREMATURO?  Comunquese con su mdico de inmediato. Debe concurrir al hospital para ser controlada inmediatamente.  CMO PUEDE EVITAR EL TRABAJO DE PARTO PREMATURO EN FUTUROS EMBARAZOS?   Si fuma, abandone el hbito.  Mantenga un aumento de peso  saludable.  Notome drogas ni manipule sustancias qumicas que no necesita.  Informe a su mdico si piensa que tiene una infeccin.  Informe a su mdico si tuvo un trabajo de parto prematuro anteriormente. Esta informacin no tiene como fin reemplazar el consejo del mdico. Asegrese de hacerle al mdico cualquier pregunta que tenga. Document Released: 08/15/2010 Document Revised: 03/15/2013 Elsevier Interactive Patient Education  2017 Elsevier Inc.  

## 2016-12-10 LAB — POCT URINALYSIS DIP (DEVICE)
BILIRUBIN URINE: NEGATIVE
GLUCOSE, UA: NEGATIVE mg/dL
KETONES UR: NEGATIVE mg/dL
Leukocytes, UA: NEGATIVE
Nitrite: NEGATIVE
PROTEIN: NEGATIVE mg/dL
Specific Gravity, Urine: 1.015 (ref 1.005–1.030)
Urobilinogen, UA: 0.2 mg/dL (ref 0.0–1.0)
pH: 6.5 (ref 5.0–8.0)

## 2016-12-24 ENCOUNTER — Ambulatory Visit (INDEPENDENT_AMBULATORY_CARE_PROVIDER_SITE_OTHER): Payer: Medicaid Other | Admitting: Obstetrics & Gynecology

## 2016-12-24 ENCOUNTER — Other Ambulatory Visit (HOSPITAL_COMMUNITY)
Admission: RE | Admit: 2016-12-24 | Discharge: 2016-12-24 | Disposition: A | Discharge status: Home / Self Care | Payer: Medicaid Other | Source: Ambulatory Visit | Attending: Obstetrics & Gynecology | Admitting: Obstetrics & Gynecology

## 2016-12-24 VITALS — BP 130/78 | HR 92 | Wt 193.3 lb

## 2016-12-24 DIAGNOSIS — O09213 Supervision of pregnancy with history of pre-term labor, third trimester: Secondary | ICD-10-CM

## 2016-12-24 DIAGNOSIS — Q512 Other doubling of uterus, unspecified: Secondary | ICD-10-CM

## 2016-12-24 DIAGNOSIS — O1213 Gestational proteinuria, third trimester: Secondary | ICD-10-CM

## 2016-12-24 DIAGNOSIS — Z3A00 Weeks of gestation of pregnancy not specified: Secondary | ICD-10-CM | POA: Diagnosis not present

## 2016-12-24 DIAGNOSIS — O0993 Supervision of high risk pregnancy, unspecified, third trimester: Secondary | ICD-10-CM | POA: Diagnosis not present

## 2016-12-24 DIAGNOSIS — O368130 Decreased fetal movements, third trimester, not applicable or unspecified: Secondary | ICD-10-CM | POA: Diagnosis not present

## 2016-12-24 DIAGNOSIS — O09219 Supervision of pregnancy with history of pre-term labor, unspecified trimester: Principal | ICD-10-CM

## 2016-12-24 DIAGNOSIS — O09899 Supervision of other high risk pregnancies, unspecified trimester: Secondary | ICD-10-CM

## 2016-12-24 DIAGNOSIS — Q5128 Other doubling of uterus, other specified: Secondary | ICD-10-CM

## 2016-12-24 NOTE — Progress Notes (Signed)
   PRENATAL VISIT NOTE  Subjective:  Carol [name] is a 21 y.o. G2P0101 at 6910w2d being seen today for ongoing prenatal care.  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  Patient reports decreased fetal movement and pelvic pressure.  Contractions: Not present. Vag. Bleeding: None.  Movement: (!) Decreased. Denies leaking of fluid.   The following portions of the patient's history were reviewed and updated as appropriate: allergies, current medications, past family history, past medical history, past social history, past surgical history and problem list. Problem list updated.  Objective:   Vitals:   12/24/16 1130  BP: 130/78  Pulse: 92  Weight: 193 lb 4.8 oz (87.7 kg)    Fetal Status: Fetal Heart Rate (bpm): 159   Movement: (!) Decreased     General:  Alert, oriented and cooperative. Patient is in no acute distress.  Skin: Skin is warm and dry. No rash noted.   Cardiovascular: Normal heart rate noted  Respiratory: Normal respiratory effort, no problems with respiration noted  Abdomen: Soft, gravid, appropriate for gestational age. Pain/Pressure: Present     Pelvic:  Cervical exam performed    2/60/bulging bag    Extremities: Normal range of motion.  Edema: None  Mental Status: Normal mood and affect. Normal behavior. Normal judgment and thought content.   Assessment and Plan:  Pregnancy: G2P0101 at 4610w2d  1. History of preterm delivery, currently pregnant - Culture, beta strep (group b only) - GC/Chlamydia probe amp ([place]  2. Decreased fetal movements in third trimester, single or  unspecified fetus - Fetal nonstress test  3. Proteinuria affecting pregnancy in third trimester -24 hour urine undetectable  4. Uterus didelphys -Breech on US.  Discussed with provider on L & D tomorrow; not a good candidate for version.  Will schedule for primary c/s at 39 weeks.    Term labor symptoms and general obstetric precautions including but not limited to vaginal bleeding, contractions, leaking of fluid and fetal movement were reviewed in detail with the patient. Please refer to After Visit Summary for other counseling recommendations.  No Follow-up on file.   Elsie LincolnKelly Leggett, MD

## 2016-12-25 ENCOUNTER — Inpatient Hospital Stay (HOSPITAL_COMMUNITY)
Admission: AD | Admit: 2016-12-25 | Discharge: 2016-12-25 | Disposition: A | Discharge status: Home / Self Care | Payer: Medicaid Other | Source: Ambulatory Visit | Attending: Obstetrics and Gynecology | Admitting: Obstetrics and Gynecology

## 2016-12-25 ENCOUNTER — Encounter (HOSPITAL_COMMUNITY): Payer: Self-pay

## 2016-12-25 DIAGNOSIS — R030 Elevated blood-pressure reading, without diagnosis of hypertension: Secondary | ICD-10-CM | POA: Diagnosis not present

## 2016-12-25 DIAGNOSIS — O9989 Other specified diseases and conditions complicating pregnancy, childbirth and the puerperium: Secondary | ICD-10-CM

## 2016-12-25 DIAGNOSIS — Z3A37 37 weeks gestation of pregnancy: Secondary | ICD-10-CM | POA: Insufficient documentation

## 2016-12-25 DIAGNOSIS — O133 Gestational [pregnancy-induced] hypertension without significant proteinuria, third trimester: Secondary | ICD-10-CM | POA: Insufficient documentation

## 2016-12-25 DIAGNOSIS — O09899 Supervision of other high risk pregnancies, unspecified trimester: Secondary | ICD-10-CM

## 2016-12-25 DIAGNOSIS — N898 Other specified noninflammatory disorders of vagina: Secondary | ICD-10-CM

## 2016-12-25 DIAGNOSIS — O4700 False labor before 37 completed weeks of gestation, unspecified trimester: Secondary | ICD-10-CM | POA: Insufficient documentation

## 2016-12-25 DIAGNOSIS — O479 False labor, unspecified: Secondary | ICD-10-CM

## 2016-12-25 DIAGNOSIS — Q600 Renal agenesis, unilateral: Secondary | ICD-10-CM

## 2016-12-25 DIAGNOSIS — O3403 Maternal care for unspecified congenital malformation of uterus, third trimester: Secondary | ICD-10-CM

## 2016-12-25 DIAGNOSIS — O09219 Supervision of pregnancy with history of pre-term labor, unspecified trimester: Secondary | ICD-10-CM

## 2016-12-25 LAB — URINALYSIS, COMPLETE (UACMP) WITH MICROSCOPIC
BACTERIA UA: NONE SEEN
BILIRUBIN URINE: NEGATIVE
GLUCOSE, UA: NEGATIVE mg/dL
KETONES UR: NEGATIVE mg/dL
LEUKOCYTES UA: NEGATIVE
Nitrite: NEGATIVE
PROTEIN: NEGATIVE mg/dL
Specific Gravity, Urine: 1.003 — ABNORMAL LOW (ref 1.005–1.030)
pH: 7 (ref 5.0–8.0)

## 2016-12-25 LAB — COMPREHENSIVE METABOLIC PANEL
ALT: 15 U/L (ref 14–54)
AST: 18 U/L (ref 15–41)
Albumin: 2.9 g/dL — ABNORMAL LOW (ref 3.5–5.0)
Alkaline Phosphatase: 122 U/L (ref 38–126)
Anion gap: 7 (ref 5–15)
BUN: 7 mg/dL (ref 6–20)
CHLORIDE: 108 mmol/L (ref 101–111)
CO2: 21 mmol/L — ABNORMAL LOW (ref 22–32)
CREATININE: 0.52 mg/dL (ref 0.44–1.00)
Calcium: 9.3 mg/dL (ref 8.9–10.3)
Glucose, Bld: 90 mg/dL (ref 65–99)
POTASSIUM: 4.8 mmol/L (ref 3.5–5.1)
SODIUM: 136 mmol/L (ref 135–145)
Total Bilirubin: 0.3 mg/dL (ref 0.3–1.2)
Total Protein: 6.9 g/dL (ref 6.5–8.1)

## 2016-12-25 LAB — CBC
HCT: 35.6 % — ABNORMAL LOW (ref 36.0–46.0)
Hemoglobin: 12.4 g/dL (ref 12.0–15.0)
MCH: 31.3 pg (ref 26.0–34.0)
MCHC: 34.8 g/dL (ref 30.0–36.0)
MCV: 89.9 fL (ref 78.0–100.0)
PLATELETS: 214 10*3/uL (ref 150–400)
RBC: 3.96 MIL/uL (ref 3.87–5.11)
RDW: 13.3 % (ref 11.5–15.5)
WBC: 18.6 10*3/uL — ABNORMAL HIGH (ref 4.0–10.5)

## 2016-12-25 LAB — GC/CHLAMYDIA PROBE AMP (~~LOC~~) NOT AT ARMC
Chlamydia: NEGATIVE
NEISSERIA GONORRHEA: NEGATIVE

## 2016-12-25 LAB — PROTEIN / CREATININE RATIO, URINE: CREATININE, URINE: 15 mg/dL

## 2016-12-25 LAB — WET PREP, GENITAL
CLUE CELLS WET PREP: NONE SEEN
SPERM: NONE SEEN
TRICH WET PREP: NONE SEEN
YEAST WET PREP: NONE SEEN

## 2016-12-25 NOTE — MAU Provider Note (Signed)
Patient Carol [name] is a 21 y.o. G2P0101  At [redacted]w[redacted]d here with complaints of headache, blurry vision and leaking fluid. Patient complains of feeling like she is coming down with a cold as she has some aches in her legs and has felt feverish. She thinks this could be related to her headache.  She denies bleeding, dysuria, flank pain.   Patient states she doesn't feel contractions but rather she feels her belly getting tight in certain spots.   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  Her baby was found to be breech at her last prenatal visit, her c-section is scheduled for week of 01-04-2017.  History     CSN: [id number]  Arrival date and time: 12/25/16 1334   First Provider Initiated Contact with Patient 12/25/16 1434      Chief Complaint  Patient presents with  . Headache  . Rupture of Membranes   Headache   This is a new problem. The current episode started today. The problem occurs intermittently. The problem has been resolved. The pain is located in the bilateral region. The pain does not radiate. The quality of the pain is described as sharp. The pain is at a severity of 4/10. Associated symptoms include abdominal pain and blurred vision. Pertinent negatives include no dizziness, nausea, visual change or vomiting. Nothing aggravates the symptoms. She has tried nothing for the symptoms.  Abdominal Pain  This is a new problem. The current episode started in the past 7 days. The problem has been unchanged. The pain is at a severity of 6/10. The quality of the pain is cramping. The abdominal pain does not radiate. Associated symptoms include headaches. Pertinent negatives include no constipation, diarrhea, dysuria, frequency, nausea or vomiting. Nothing aggravates the pain. She has tried nothing for the symptoms.   Blurry vision-she feels that she has blurry vision when she walks across the  parking lot in the heat.  Denies blurry vision while in MAU.   Leaking of fluid that started this morning; like mucous. Denies bleeding.  OB History    Gravida Para Term Preterm AB Living   2 1   1   1    SAB TAB Ectopic Multiple Live Births           1      Past Medical History:  Diagnosis Date  . 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 NO PAST SURGERIES      Family History  Problem Relation Age of Onset  . Diabetes Mother   . Diabetes Father     Social History  Substance Use Topics  . Smoking status: Never Smoker  . Smokeless tobacco: Never Used  . Alcohol use No    Allergies: No Known Allergies  Prescriptions Prior to Admission  Medication Sig Dispense Refill Last Dose  . Prenatal Multivit-Min-Fe-FA (PRENATAL VITAMINS) 0.8 MG tablet Take 1 tablet by mouth daily. 30 tablet 12 Past Week at Unknown time    Review of Systems  Eyes: Positive for blurred vision.  Gastrointestinal: Positive for abdominal pain. Negative for constipation, diarrhea, nausea and vomiting.  Genitourinary: Negative for dysuria and frequency.  Neurological: Positive for headaches. Negative for dizziness.   Physical Exam   Blood pressure 132/82, pulse (!) 125, height  5\' 4"  (1.626 m), weight 193 lb (87.5 kg), SpO2 100 %.  Physical Exam  Constitutional: She is oriented to person, place, and time. She appears well-developed and well-nourished.  HENT:  Head: Normocephalic.  Neck: Normal range of motion.  Respiratory: Effort normal.  GI: Soft. She exhibits no distension and no mass. There is no tenderness. There is no rebound and no guarding.  Genitourinary: Vagina normal. No vaginal discharge found.  Musculoskeletal: Normal range of motion.  Neurological: She is alert and oriented to person, place, and time.   Skin: Skin is warm and dry.  Psychiatric: She has a normal mood and affect.  Cervic is FT, thick, posterior.  No CVA tenderness.  MAU Course  Procedures  MDM  -NST:  150 bpm with moderate variability, acels, no decels, small, irregular contractions that patient does not feel.   -A few elevated blood pressures elevated in MAU mjority normotensive. Patient's headache is resolved and UPC is undetectable. CMP and CBC normal, other than elevated white count.   -UA clear; UC pending  -wet prep: wnl  -At discharge, patient had a temperature of 100.3, which may explain elevated white count and possible aches and HA.  -Case discussed with Dr. Alysia PennaErvin, who suggests that patient return tomorrow for follow up BP check in the MAU and to see how she is feeling.   Assessment and Plan   1. Braxton Hicks 2. Transient hypertension 3. Vaginal discharge   2. Patient stable for discharge with instructions to try tylenol for fever and pain. Reviewed when to return to the MAU (increased fever, overall malaise, increased contractions, bleeding, blurry vision, HA not relieved with Tylenol, decreased fetal movements)  3. Patient and partner verbalized understanding; all questions answered.  Charlesetta GaribaldiKathryn Lorraine Kooistra 12/25/2016, 4:58 PM

## 2016-12-25 NOTE — Discharge Instructions (Signed)
Hipertensin durante el embarazo (Hypertension During Pregnancy) La hipertensin tambin se denomina presin arterial alta. La presin arterial hace que se mueva la sangre en el cuerpo. A veces, la fuerza que mueve la sangre es demasiado intensa. Cuando est embarazada, esta afeccin se debe controlar atentamente. Puede causar problemas para usted y su beb. CUIDADOS EN EL HOGAR  Cumpla con todos los controles mdicos.  Tome los medicamentos como le indic su mdico. Informe a su mdico sobre todos los medicamentos que toma.  Coma muy poca sal.  Haga ejercicios regularmente.  No beba alcohol.  No fume.  No tome bebidas con cafena.  Acustese sobre el lado izquierdo cuando haga reposo.  Su mdico puede recomendarle que tome una aspirina de dosis baja (81 mg) cada da.  SOLICITE AYUDA DE INMEDIATO SI:  Siente un dolor intenso en el vientre (abdominal).  Nota una hinchazn repentina en las manos, los tobillos o la cara.  Aument 4libras (1,8kg) o ms en 1semana.  Vomita) repetidas veces.  Tiene una hemorragia por la vagina.  No siente los movimientos del beb.  Tiene cefalea.  Tiene visin doble o borrosa.  Tiene calambres o espasmos musculares.  Le falta el aire.  Tiene las yemas de los dedos y los labios azules.  Observa sangre en la orina.  ASEGRESE DE QUE:  Comprende estas instrucciones.  Controlar su afeccin.  Recibir ayuda de inmediato si no mejora o si empeora.  Esta informacin no tiene como fin reemplazar el consejo del mdico. Asegrese de hacerle al mdico cualquier pregunta que tenga. Document Released: 10/28/2010 Document Revised: 08/03/2014 Document Reviewed: 12/27/2015 Elsevier Interactive Patient Education  2017 Elsevier Inc.  

## 2016-12-25 NOTE — MAU Note (Signed)
Urine sent to lab 

## 2016-12-25 NOTE — MAU Note (Signed)
Pt c/o having a headache when she woke up this morning and not feeling right. Pt c/o blurry vision. Pt states she was at the MD office yesterday and was told her baby is breech. Pt states after she left the MD office she started leaking clear fluid. Pt noted this happened first yesterday around noon. Pt c/o some pink discharge today with watery leaking. Pt states baby is moving normally.

## 2016-12-26 ENCOUNTER — Inpatient Hospital Stay (HOSPITAL_COMMUNITY)
Admission: AD | Admit: 2016-12-26 | Discharge: 2016-12-26 | Disposition: A | Discharge status: Home / Self Care | Payer: Medicaid Other | Source: Ambulatory Visit | Attending: Obstetrics & Gynecology | Admitting: Obstetrics & Gynecology

## 2016-12-26 ENCOUNTER — Encounter (HOSPITAL_COMMUNITY): Payer: Self-pay

## 2016-12-26 DIAGNOSIS — R51 Headache: Secondary | ICD-10-CM | POA: Diagnosis present

## 2016-12-26 DIAGNOSIS — Z3A37 37 weeks gestation of pregnancy: Secondary | ICD-10-CM | POA: Insufficient documentation

## 2016-12-26 DIAGNOSIS — O26893 Other specified pregnancy related conditions, third trimester: Secondary | ICD-10-CM | POA: Insufficient documentation

## 2016-12-26 DIAGNOSIS — Z833 Family history of diabetes mellitus: Secondary | ICD-10-CM | POA: Diagnosis not present

## 2016-12-26 DIAGNOSIS — O133 Gestational [pregnancy-induced] hypertension without significant proteinuria, third trimester: Secondary | ICD-10-CM | POA: Insufficient documentation

## 2016-12-26 MED ORDER — BUTALBITAL-APAP-CAFFEINE 50-325-40 MG PO TABS
1.0000 | ORAL_TABLET | Freq: Once | ORAL | Status: AC
  Administered 2016-12-26: 1 via ORAL
  Filled 2016-12-26: qty 1

## 2016-12-26 NOTE — MAU Provider Note (Signed)
History   G2P0101 @ 37.4 wks in with c/o headache and BP check.   CSN: [id number]  Arrival date & time 12/26/16  1142   None     Chief Complaint  Patient presents with  . contractons    HPI  Past Medical History:  Diagnosis Date  . 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 NO PAST SURGERIES      Family History  Problem Relation Age of Onset  . Diabetes Mother   . Diabetes Father     Social History  Substance Use Topics  . Smoking status: Never Smoker  . Smokeless tobacco: Never Used  . Alcohol use No    OB History    Gravida Para Term Preterm AB Living   2 1   1   1    SAB TAB Ectopic Multiple Live Births           1      Review of Systems  Constitutional: Negative.   HENT: Negative.   Eyes: Negative.   Respiratory: Negative.   Cardiovascular: Negative.   Gastrointestinal: Positive for abdominal pain.  Genitourinary: Negative.   Musculoskeletal: Negative.   Skin: Negative.   Neurological: Positive for headaches.  Hematological: Negative.   Psychiatric/Behavioral: Negative.     Allergies  Patient has no known allergies.  Home Medications    BP 131/90 (BP Location: Right Arm)   Pulse (!) 127   Temp 99.3 F (37.4 C) (Oral)   Resp 18   LMP  (LMP Unknown)   SpO2 97%   Physical Exam  Constitutional: She is oriented to person, place, and time. She appears well-developed and well-nourished.  HENT:  Head: Normocephalic.  Neck: Normal range of motion.  Cardiovascular: Normal rate, regular rhythm, normal heart sounds and intact distal pulses.   Pulmonary/Chest: Effort normal and breath sounds normal.  Abdominal: Soft. Bowel sounds are normal.  Genitourinary: Vagina normal and uterus normal.  Musculoskeletal: Normal range of motion.  Neurological: She is alert and  oriented to person, place, and time. She has normal reflexes.  Skin: Skin is warm and dry.  Psychiatric: She has a normal mood and affect. Her behavior is normal. Judgment and thought content normal.    MAU Course  Procedures (including critical care time)  Labs Reviewed  URINALYSIS, ROUTINE W REFLEX MICROSCOPIC   No results found.   1. PIH (pregnancy induced hypertension), third trimester       MDM  PIH @ 37.4 wks Headache VSS, DTR's trace , no clonus. POC discussed with Dr. Debroah LoopArnold pt to be d/c home to follow up wed in office. FHR pattern reassurring.

## 2016-12-26 NOTE — Progress Notes (Addendum)
Provider at bs assessing pt. SVE closed.   G2P1 @ 37.[redacted] wksga. Presents to triage to re-check bp per provider yesterday from the office. Pt c/o ha since Thursday. [facility] gave her tylenol for ha and high temperature as noted in flow sheet.  Denies LOF or bleeding. +FM. EFM applied. Breech and plan for PC/S next tueday.   1236: medicated for ha per order. Pt resting quietly with sister at bs.    1300: ha improved from medication.   1310: Discharge instructions given with pt understanding. Pt left unit via ambulatory with sister.

## 2016-12-26 NOTE — Discharge Instructions (Signed)
Hipertensin durante el embarazo (Hypertension During Pregnancy) La hipertensin, conocida comnmente como presin arterial alta, se produce cuando la sangre bombea en las arterias con mucha fuerza. Las arterias son vasos sanguneos que transportan la sangre desde el corazn al resto del cuerpo. La hipertensin durante el embarazo puede causar problemas para usted y el beb. Es posible que el beb no tenga el peso adecuado al nacer o puede que nazca antes de tiempo (prematuro). En los casos muy graves de hipertensin durante el embarazo puede estar en peligro la vida. Durante el embarazo se pueden presentar diferentes tipos de hipertensin arterial. Estos incluyen los siguientes:  Hipertensin crnica. Esto puede suceder en las siguientes ocasiones: ? Cuando una mujer sufre de hipertensin antes del embarazo y contina con hipertensin durante este. ? Cuando una mujer sufre de hipertensin arterial antes de las 20semanas de embarazo y contina durante todo el embarazo.  Hipertensin gestacional. Esta se desarrolla a partir de la semana 20de embarazo.  Preeclampsia, tambin se la conoce como toxemia del embarazo. Es un tipo muy grave de hipertensin que se desarrolla solo durante el embarazo. Afecta a todo el cuerpo y puede ser muy peligrosa para la madre y el beb. La hipertensin gestacional y preeclampsia por lo general, desaparecen en un perodo de 6semanas despus del nacimiento del beb. Las mujeres que sufren de hipertensin durante el embarazo tienen una mayor probabilidad de desarrollar hipertensin en etapas posteriores de la vida o en embarazos futuros. CAUSAS Se desconoce la causa exacta de la hipertensin. FACTORES DE RIESGO Existen ciertos factores que aumentan las probabilidades de que desarrolle hipertensin durante el embarazo. Estos incluyen los siguientes:  Haber sufrido hipertensin durante un embarazo anterior o antes del embarazo.  Tener sobrepeso.  Ser mayor de  40aos.  Estar embarazada por primera vez o de ms de un beb.  Embarazarse a travs de mtodos de fertilizacin como FIV (fertilizacin in vitro).  Tener diabetes, problemas renales o lupus eritematoso sistmico.  Tener antecedentes familiares de hipertensin. SNTOMAS La hipertensin crnica y gestacional en raras ocasiones provoca sntomas. La preeclampsia causa sntomas, que pueden incluir, entre otros:  Aumento de las protenas en la orina. El mdico la controlar en cada visita prenatal.  Dolor de cabeza intenso.  Aumento repentino de peso.  Hinchazn de las manos, del rostro, de las piernas y de los pies.  Nuseas y vmitos.  Problemas visuales, como visin doble o borrosa.  Adormecimiento del rostro, de los brazos, de las piernas y de los pies.  Mareos.  Hablar arrastrando las palabras.  Sensibilidad a las luces brillantes.  Dolor abdominal.  Espasmos. DIAGNSTICO Es posible que se le diagnostique hipertensin durante un control prenatal de rutina. En cada visita prenatal, es posible que le realicen los siguientes exmenes:  Un anlisis de orina para determinar si tiene grandes cantidades de protena en la orina.  Controle su presin arterial. La lectura de la presin arterial se registra con dos nmeros, por ejemplo "120 sobre 80" (o 120/80). El primer nmero ("superior") es la presin sistlica. Es la medida de la presin de las arterias cuando el corazn late. El segundo nmero ("inferior") es la presin diastlica. Es la medida de la presin en las arterias cuando el corazn se relaja entre latidos. La presin sangunea se mide en una unidad llamada mmHg. Una lectura normal de la presin arterial sera: ? Presin sistlica: por debajo de 120. ? Presin diastlica: por debajo de 80. El tipo de hipertensin que le diagnostiquen depender de los resultados de   las pruebas y de cundo se desarrollaron los sntomas.  La hipertensin crnica frecuentemente se  diagnostica antes de las 20semanas de embarazo.  La hipertensin gestacional frecuentemente se diagnostica despus de las 20semanas de embarazo.  La hipertensin con grandes cantidades de protenas en la orina se diagnostica como preeclampsia.  Las mediciones de la presin arterial de ms de 160sistlica o 110diastlica son un signo de preeclampsia grave. TRATAMIENTO El tratamiento para la hipertensin durante el embarazo difiere segn el tipo de hipertensin que tiene y de su gravedad.  Si toma medicamentos inhibidores de la enzima convertidora de la angiotensina (IECA) para tratar la hipertensin crnica, es posible que deba cambiar los medicamentos. Los medicamentos inhibidores de la enzima convertidora de la angiotensina (IECA) no deben tomarse durante el embarazo.  Si usted tiene hipertensin gestacional, tendr que tomar un medicamento para la presin arterial.  Su mdico puede recomendarle que tome una aspirina de dosis baja todos los das, a fin de ayudar a prevenir la hipertensin durante el embarazo, si est en riesgo de padecer preeclampsia.  Si tiene preeclampsia grave, es posible que deba hospitalizarse para que usted y el beb puedan ser controlados atentamente. Puede ser que necesite tomar medicamentos (sulfato de magnesio) para prevenir las convulsiones y reducir la presin arterial. El medicamento se puede administrar como una inyeccin o por va intravenosa (IV).  En ciertos casos, si su afeccin empeora, es posible que necesite dar a luz de forma ms temprana. INSTRUCCIONES PARA EL CUIDADO EN EL HOGAR Comida y bebida  Beba suficiente lquido para mantener la orina clara o de color amarillo plido.  Mantenga una dieta saludable con bajo contenido de sal (sodio). No agregue sal a las comidas. Verifique las etiquetas de los alimentos para conocer cunto sodio hay en una comida o una bebida. Estilo de vida  No consuma ningn producto que contenga nicotina o tabaco, como  cigarrillos y cigarrillos electrnicos. Si necesita ayuda para dejar de fumar, consulte al mdico.  No consuma alcohol.  Evite la cafena.  Evite las situaciones estresantes tanto como sea posible. Haga reposo y duerma bien. Instrucciones generales  Tome los medicamentos de venta libre y los recetados solamente como se lo haya indicado el mdico.  Acustese sobre el lado izquierdo mientras hace reposo. De este modo, se quita la presin del beb.  Eleve las piernas mientras est sentada o recostada. Intente colocar algunas almohadas debajo de las pantorrillas.  Haga ejercicios regularmente. Consulte al mdico qu tipos de ejercicios son seguros para usted.  Concurra a todas las visitas de control prenatales y de seguimiento como se lo haya indicado el mdico. Esto es importante. SOLICITE ATENCIN MDICA SI:  Tiene sntomas que su mdico le ha informado pueden requerir ms tratamiento o control, por ejemplo: ? Fiebre. ? Vmitos. ? Dolor de cabeza. SOLICITE ATENCIN MDICA DE INMEDIATO SI:  Tiene dolor abdominal intenso o vmitos que no mejoran con el tratamiento.  Presenta hinchazn repentina en las manos, los tobillos o el rostro.  Aumenta ms de 4libras (1,8kg) o ms en una semana.  Tiene hemorragia vaginal o tiene sangre en la orina.  No siente que el beb se mueva de forma habitual.  Tiene visin doble o borrosa.  Tiene calambres o espasmos musculares repentinos.  Le falta el aire.  Sus labios o uas comienzan a volverse azules. Esta informacin no tiene como fin reemplazar el consejo del mdico. Asegrese de hacerle al mdico cualquier pregunta que tenga. Document Released: 07/02/2011 Document Revised: 08/03/2014 Document Reviewed: 12/27/2015   Elsevier Interactive Patient Education  2018 Elsevier Inc.  

## 2016-12-27 ENCOUNTER — Encounter (HOSPITAL_COMMUNITY): Payer: Self-pay

## 2016-12-27 ENCOUNTER — Telehealth: Payer: Self-pay | Admitting: Student

## 2016-12-27 ENCOUNTER — Other Ambulatory Visit: Payer: Self-pay | Admitting: Student

## 2016-12-27 DIAGNOSIS — B951 Streptococcus, group B, as the cause of diseases classified elsewhere: Secondary | ICD-10-CM

## 2016-12-27 DIAGNOSIS — R8271 Bacteriuria: Secondary | ICD-10-CM | POA: Insufficient documentation

## 2016-12-27 HISTORY — DX: Streptococcus, group b, as the cause of diseases classified elsewhere: B95.1

## 2016-12-27 LAB — CULTURE, OB URINE: Culture: 50000 — AB

## 2016-12-27 LAB — CULTURE, BETA STREP (GROUP B ONLY): Strep Gp B Culture: POSITIVE — AB

## 2016-12-27 MED ORDER — AMOXICILLIN 500 MG PO CAPS: 500.0000 mg | ORAL_CAPSULE | Freq: Two times a day (BID) | ORAL | 0 refills | Status: DC

## 2016-12-27 NOTE — Telephone Encounter (Addendum)
Spoke with patient; informed her that she has a UTI and that she needs to start her antibiotic as soon as possible. Patient states that she feels some urgency when she urinates and that she feels like she only goes " a little bit" when she urinates. She says she has side pain but not back pain. She denies fever; she says she feels some pain at times in her lower abdomen.   Patient [facility] antibiotic tomorrow [facility]. Reassured patient that if she started to feel worse she should come back to the MAU for evaluation.   Message sent to [facility] to have patient come back on Wednesday for a follow-up appt. Patient knows to wait for phone call.

## 2016-12-28 ENCOUNTER — Inpatient Hospital Stay (HOSPITAL_COMMUNITY): Payer: Medicaid Other | Admitting: Anesthesiology

## 2016-12-28 ENCOUNTER — Encounter (HOSPITAL_COMMUNITY): Payer: Self-pay | Admitting: Anesthesiology

## 2016-12-28 ENCOUNTER — Encounter (HOSPITAL_COMMUNITY)
Admission: AD | Disposition: A | Discharge status: Home / Self Care | Payer: Self-pay | Source: Ambulatory Visit | Attending: Obstetrics and Gynecology

## 2016-12-28 ENCOUNTER — Encounter (HOSPITAL_COMMUNITY): Payer: Self-pay

## 2016-12-28 ENCOUNTER — Inpatient Hospital Stay (HOSPITAL_COMMUNITY)
Admission: AD | Admit: 2016-12-28 | Discharge: 2016-12-30 | DRG: 766 | Disposition: A | Discharge status: Home / Self Care | Payer: Medicaid Other | Source: Ambulatory Visit | Attending: Obstetrics and Gynecology | Admitting: Obstetrics and Gynecology

## 2016-12-28 DIAGNOSIS — Z3A37 37 weeks gestation of pregnancy: Secondary | ICD-10-CM

## 2016-12-28 DIAGNOSIS — Z302 Encounter for sterilization: Secondary | ICD-10-CM

## 2016-12-28 DIAGNOSIS — O321XX0 Maternal care for breech presentation, not applicable or unspecified: Secondary | ICD-10-CM | POA: Diagnosis present

## 2016-12-28 DIAGNOSIS — O3403 Maternal care for unspecified congenital malformation of uterus, third trimester: Secondary | ICD-10-CM | POA: Diagnosis present

## 2016-12-28 DIAGNOSIS — O09899 Supervision of other high risk pregnancies, unspecified trimester: Secondary | ICD-10-CM

## 2016-12-28 DIAGNOSIS — Z98891 History of uterine scar from previous surgery: Secondary | ICD-10-CM

## 2016-12-28 DIAGNOSIS — R8271 Bacteriuria: Secondary | ICD-10-CM

## 2016-12-28 DIAGNOSIS — O99824 Streptococcus B carrier state complicating childbirth: Secondary | ICD-10-CM | POA: Diagnosis present

## 2016-12-28 DIAGNOSIS — Q512 Other doubling of uterus: Secondary | ICD-10-CM

## 2016-12-28 DIAGNOSIS — Q600 Renal agenesis, unilateral: Secondary | ICD-10-CM

## 2016-12-28 DIAGNOSIS — O09219 Supervision of pregnancy with history of pre-term labor, unspecified trimester: Secondary | ICD-10-CM

## 2016-12-28 LAB — CBC
HEMATOCRIT: 35.5 % — AB (ref 36.0–46.0)
Hemoglobin: 12.3 g/dL (ref 12.0–15.0)
MCH: 31 pg (ref 26.0–34.0)
MCHC: 34.6 g/dL (ref 30.0–36.0)
MCV: 89.4 fL (ref 78.0–100.0)
PLATELETS: 203 10*3/uL (ref 150–400)
RBC: 3.97 MIL/uL (ref 3.87–5.11)
RDW: 13.4 % (ref 11.5–15.5)
WBC: 15.1 10*3/uL — ABNORMAL HIGH (ref 4.0–10.5)

## 2016-12-28 LAB — TYPE AND SCREEN
ABO/RH(D): O POS
Antibody Screen: NEGATIVE

## 2016-12-28 LAB — RPR: RPR: NONREACTIVE

## 2016-12-28 SURGERY — Surgical Case
Anesthesia: *Unknown

## 2016-12-28 SURGERY — Surgical Case
Anesthesia: Spinal | Laterality: Bilateral | Wound class: Clean Contaminated

## 2016-12-28 MED ORDER — SCOPOLAMINE 1 MG/3DAYS TD PT72
MEDICATED_PATCH | TRANSDERMAL | Status: AC
  Filled 2016-12-28: qty 1

## 2016-12-28 MED ORDER — PNEUMOCOCCAL VAC POLYVALENT 25 MCG/0.5ML IJ INJ
0.5000 mL | INJECTION | INTRAMUSCULAR | Status: AC
  Administered 2016-12-29: 0.5 mL via INTRAMUSCULAR
  Filled 2016-12-28 (×2): qty 0.5

## 2016-12-28 MED ORDER — MEPERIDINE HCL 25 MG/ML IJ SOLN: 6.2500 mg | INTRAMUSCULAR | Status: DC | PRN

## 2016-12-28 MED ORDER — ONDANSETRON HCL 4 MG/2ML IJ SOLN: 4.0000 mg | Freq: Three times a day (TID) | INTRAMUSCULAR | Status: DC | PRN

## 2016-12-28 MED ORDER — SENNOSIDES-DOCUSATE SODIUM 8.6-50 MG PO TABS
2.0000 | ORAL_TABLET | ORAL | Status: DC
  Administered 2016-12-29 (×2): 2 via ORAL
  Filled 2016-12-28 (×2): qty 2

## 2016-12-28 MED ORDER — CEFAZOLIN SODIUM-DEXTROSE 2-4 GM/100ML-% IV SOLN
2.0000 g | INTRAVENOUS | Status: AC
  Administered 2016-12-28: 2 g via INTRAVENOUS

## 2016-12-28 MED ORDER — IBUPROFEN 600 MG PO TABS
600.0000 mg | ORAL_TABLET | Freq: Four times a day (QID) | ORAL | Status: DC
  Administered 2016-12-28 – 2016-12-30 (×8): 600 mg via ORAL
  Filled 2016-12-28 (×8): qty 1

## 2016-12-28 MED ORDER — NALBUPHINE HCL 10 MG/ML IJ SOLN: 5.0000 mg | INTRAMUSCULAR | Status: DC | PRN

## 2016-12-28 MED ORDER — KETOROLAC TROMETHAMINE 30 MG/ML IJ SOLN: 30.0000 mg | Freq: Once | INTRAMUSCULAR | Status: DC

## 2016-12-28 MED ORDER — OXYCODONE HCL 5 MG PO TABS
5.0000 mg | ORAL_TABLET | ORAL | Status: DC | PRN
  Administered 2016-12-28 – 2016-12-30 (×5): 5 mg via ORAL
  Filled 2016-12-28 (×5): qty 1

## 2016-12-28 MED ORDER — SIMETHICONE 80 MG PO CHEW
80.0000 mg | CHEWABLE_TABLET | ORAL | Status: DC
  Administered 2016-12-29 (×2): 80 mg via ORAL
  Filled 2016-12-28 (×2): qty 1

## 2016-12-28 MED ORDER — OXYTOCIN 10 UNIT/ML IJ SOLN
INTRAVENOUS | Status: DC | PRN
  Administered 2016-12-28: 40 [IU] via INTRAVENOUS

## 2016-12-28 MED ORDER — PRENATAL MULTIVITAMIN CH
1.0000 | ORAL_TABLET | Freq: Every day | ORAL | Status: DC
  Administered 2016-12-28 – 2016-12-30 (×3): 1 via ORAL
  Filled 2016-12-28 (×3): qty 1

## 2016-12-28 MED ORDER — CEFAZOLIN SODIUM-DEXTROSE 2-4 GM/100ML-% IV SOLN
INTRAVENOUS | Status: AC
  Filled 2016-12-28: qty 100

## 2016-12-28 MED ORDER — FENTANYL CITRATE (PF) 100 MCG/2ML IJ SOLN: 25.0000 ug | INTRAMUSCULAR | Status: DC | PRN

## 2016-12-28 MED ORDER — FAMOTIDINE IN NACL 20-0.9 MG/50ML-% IV SOLN
INTRAVENOUS | Status: AC
  Administered 2016-12-28: 05:00:00
  Filled 2016-12-28: qty 50

## 2016-12-28 MED ORDER — SOD CITRATE-CITRIC ACID 500-334 MG/5ML PO SOLN: 30.0000 mL | Freq: Once | ORAL | Status: DC

## 2016-12-28 MED ORDER — MENTHOL 3 MG MT LOZG: 1.0000 | LOZENGE | OROMUCOSAL | Status: DC | PRN

## 2016-12-28 MED ORDER — ONDANSETRON HCL 4 MG/2ML IJ SOLN
INTRAMUSCULAR | Status: AC
Start: 2016-12-28 — End: 2016-12-28
  Filled 2016-12-28: qty 2

## 2016-12-28 MED ORDER — FAMOTIDINE IN NACL 20-0.9 MG/50ML-% IV SOLN: 20.0000 mg | Freq: Once | INTRAVENOUS | Status: DC

## 2016-12-28 MED ORDER — BUPIVACAINE IN DEXTROSE 0.75-8.25 % IT SOLN
INTRATHECAL | Status: DC | PRN
  Administered 2016-12-28: 10.5 mg via INTRATHECAL

## 2016-12-28 MED ORDER — NALBUPHINE HCL 10 MG/ML IJ SOLN
INTRAMUSCULAR | Status: AC
  Filled 2016-12-28: qty 1

## 2016-12-28 MED ORDER — ACETAMINOPHEN 500 MG PO TABS
1000.0000 mg | ORAL_TABLET | Freq: Four times a day (QID) | ORAL | Status: AC
  Administered 2016-12-28 – 2016-12-29 (×2): 1000 mg via ORAL
  Filled 2016-12-28 (×2): qty 2

## 2016-12-28 MED ORDER — WITCH HAZEL-GLYCERIN EX PADS: 1.0000 "application " | MEDICATED_PAD | CUTANEOUS | Status: DC | PRN

## 2016-12-28 MED ORDER — DIBUCAINE 1 % RE OINT: 1.0000 "application " | TOPICAL_OINTMENT | RECTAL | Status: DC | PRN

## 2016-12-28 MED ORDER — OXYTOCIN 40 UNITS IN LACTATED RINGERS INFUSION - SIMPLE MED: 2.5000 [IU]/h | INTRAVENOUS | Status: AC

## 2016-12-28 MED ORDER — ACETAMINOPHEN 325 MG PO TABS: 650.0000 mg | ORAL_TABLET | ORAL | Status: DC | PRN

## 2016-12-28 MED ORDER — FENTANYL CITRATE (PF) 100 MCG/2ML IJ SOLN
INTRAMUSCULAR | Status: AC
  Filled 2016-12-28: qty 2

## 2016-12-28 MED ORDER — SOD CITRATE-CITRIC ACID 500-334 MG/5ML PO SOLN
ORAL | Status: AC
  Administered 2016-12-28: 05:00:00
  Filled 2016-12-28: qty 15

## 2016-12-28 MED ORDER — DIPHENHYDRAMINE HCL 25 MG PO CAPS
25.0000 mg | ORAL_CAPSULE | ORAL | Status: DC | PRN
Start: 2016-12-28 — End: 2016-12-30
  Filled 2016-12-28: qty 1

## 2016-12-28 MED ORDER — SODIUM CHLORIDE 0.9% FLUSH: 3.0000 mL | INTRAVENOUS | Status: DC | PRN

## 2016-12-28 MED ORDER — METOCLOPRAMIDE HCL 5 MG/ML IJ SOLN: 10.0000 mg | Freq: Once | INTRAMUSCULAR | Status: DC | PRN

## 2016-12-28 MED ORDER — NALBUPHINE HCL 10 MG/ML IJ SOLN: 5.0000 mg | Freq: Once | INTRAMUSCULAR | Status: AC | PRN

## 2016-12-28 MED ORDER — KETOROLAC TROMETHAMINE 30 MG/ML IJ SOLN
INTRAMUSCULAR | Status: AC
  Filled 2016-12-28: qty 1

## 2016-12-28 MED ORDER — NALOXONE HCL 0.4 MG/ML IJ SOLN
0.4000 mg | INTRAMUSCULAR | Status: DC | PRN
Start: 2016-12-28 — End: 2016-12-30

## 2016-12-28 MED ORDER — AMOXICILLIN 500 MG PO CAPS
500.0000 mg | ORAL_CAPSULE | Freq: Two times a day (BID) | ORAL | Status: DC
  Filled 2016-12-28: qty 1

## 2016-12-28 MED ORDER — COCONUT OIL OIL: 1.0000 "application " | TOPICAL_OIL | Status: DC | PRN

## 2016-12-28 MED ORDER — TETANUS-DIPHTH-ACELL PERTUSSIS 5-2.5-18.5 LF-MCG/0.5 IM SUSP: 0.5000 mL | Freq: Once | INTRAMUSCULAR | Status: DC

## 2016-12-28 MED ORDER — LACTATED RINGERS IV SOLN
INTRAVENOUS | Status: DC | PRN
  Administered 2016-12-28: 06:00:00 via INTRAVENOUS

## 2016-12-28 MED ORDER — DIPHENHYDRAMINE HCL 25 MG PO CAPS: 25.0000 mg | ORAL_CAPSULE | Freq: Four times a day (QID) | ORAL | Status: DC | PRN

## 2016-12-28 MED ORDER — NALOXONE HCL 2 MG/2ML IJ SOSY
1.0000 ug/kg/h | PREFILLED_SYRINGE | INTRAMUSCULAR | Status: DC | PRN
  Filled 2016-12-28: qty 2

## 2016-12-28 MED ORDER — MORPHINE SULFATE (PF) 0.5 MG/ML IJ SOLN
INTRAMUSCULAR | Status: DC | PRN
  Administered 2016-12-28: 200 ug via INTRATHECAL

## 2016-12-28 MED ORDER — DIPHENHYDRAMINE HCL 50 MG/ML IJ SOLN: 12.5000 mg | INTRAMUSCULAR | Status: DC | PRN

## 2016-12-28 MED ORDER — KETOROLAC TROMETHAMINE 30 MG/ML IJ SOLN
30.0000 mg | Freq: Once | INTRAMUSCULAR | Status: AC
  Administered 2016-12-28: 30 mg via INTRAMUSCULAR

## 2016-12-28 MED ORDER — MORPHINE SULFATE (PF) 0.5 MG/ML IJ SOLN
INTRAMUSCULAR | Status: AC
  Filled 2016-12-28: qty 10

## 2016-12-28 MED ORDER — PHENYLEPHRINE 8 MG IN D5W 100 ML (0.08MG/ML) PREMIX OPTIME
INJECTION | INTRAVENOUS | Status: AC
  Filled 2016-12-28: qty 100

## 2016-12-28 MED ORDER — SIMETHICONE 80 MG PO CHEW: 80.0000 mg | CHEWABLE_TABLET | ORAL | Status: DC | PRN

## 2016-12-28 MED ORDER — SCOPOLAMINE 1 MG/3DAYS TD PT72
MEDICATED_PATCH | TRANSDERMAL | Status: DC | PRN
  Administered 2016-12-28: 1 via TRANSDERMAL

## 2016-12-28 MED ORDER — TERBUTALINE SULFATE 1 MG/ML IJ SOLN
INTRAMUSCULAR | Status: AC
  Administered 2016-12-28: 0.25 mg
  Filled 2016-12-28: qty 1

## 2016-12-28 MED ORDER — ONDANSETRON HCL 4 MG/2ML IJ SOLN
INTRAMUSCULAR | Status: DC | PRN
  Administered 2016-12-28: 4 mg via INTRAVENOUS

## 2016-12-28 MED ORDER — SIMETHICONE 80 MG PO CHEW
80.0000 mg | CHEWABLE_TABLET | Freq: Three times a day (TID) | ORAL | Status: DC
  Administered 2016-12-28 – 2016-12-30 (×6): 80 mg via ORAL
  Filled 2016-12-28 (×6): qty 1

## 2016-12-28 MED ORDER — LACTATED RINGERS IV BOLUS (SEPSIS): 500.0000 mL | Freq: Once | INTRAVENOUS | Status: DC

## 2016-12-28 MED ORDER — LACTATED RINGERS IV SOLN
INTRAVENOUS | Status: DC
  Administered 2016-12-28: 125 mL/h via INTRAVENOUS

## 2016-12-28 MED ORDER — OXYCODONE HCL 5 MG PO TABS
10.0000 mg | ORAL_TABLET | ORAL | Status: DC | PRN
  Administered 2016-12-29 (×2): 10 mg via ORAL
  Filled 2016-12-28 (×2): qty 2

## 2016-12-28 MED ORDER — NALBUPHINE HCL 10 MG/ML IJ SOLN
5.0000 mg | Freq: Once | INTRAMUSCULAR | Status: AC | PRN
  Administered 2016-12-28: 5 mg via SUBCUTANEOUS

## 2016-12-28 MED ORDER — OXYTOCIN 10 UNIT/ML IJ SOLN
INTRAMUSCULAR | Status: AC
  Filled 2016-12-28: qty 4

## 2016-12-28 MED ORDER — FENTANYL CITRATE (PF) 100 MCG/2ML IJ SOLN
INTRAMUSCULAR | Status: DC | PRN
  Administered 2016-12-28: 20 ug via INTRATHECAL

## 2016-12-28 MED ORDER — LACTATED RINGERS IV SOLN
INTRAVENOUS | Status: DC | PRN
  Administered 2016-12-28 (×2): via INTRAVENOUS

## 2016-12-28 SURGICAL SUPPLY — 34 items
BENZOIN TINCTURE PRP APPL 2/3 (GAUZE/BANDAGES/DRESSINGS) ×3 IMPLANT
CHLORAPREP W/TINT 26ML (MISCELLANEOUS) ×3 IMPLANT
CLAMP CORD UMBIL (MISCELLANEOUS) ×3 IMPLANT
CLOTH BEACON ORANGE TIMEOUT ST (SAFETY) ×3 IMPLANT
DRSG OPSITE POSTOP 4X10 (GAUZE/BANDAGES/DRESSINGS) ×3 IMPLANT
ELECT REM PT RETURN 9FT ADLT (ELECTROSURGICAL) ×3
ELECTRODE REM PT RTRN 9FT ADLT (ELECTROSURGICAL) ×1 IMPLANT
EXTRACTOR VACUUM M CUP 4 TUBE (SUCTIONS) ×2 IMPLANT
EXTRACTOR VACUUM M CUP 4' TUBE (SUCTIONS) ×1
GLOVE BIOGEL PI IND STRL 6.5 (GLOVE) ×1 IMPLANT
GLOVE BIOGEL PI IND STRL 7.0 (GLOVE) ×5 IMPLANT
GLOVE BIOGEL PI INDICATOR 6.5 (GLOVE) ×2
GLOVE BIOGEL PI INDICATOR 7.0 (GLOVE) ×10
GLOVE ECLIPSE 7.0 STRL STRAW (GLOVE) ×3 IMPLANT
GLOVE SURG SS PI 6.0 STRL IVOR (GLOVE) ×6 IMPLANT
GLOVE SURG SS PI 7.0 STRL IVOR (GLOVE) ×6 IMPLANT
GOWN STRL REUS W/TWL LRG LVL3 (GOWN DISPOSABLE) ×6 IMPLANT
KIT ABG SYR 3ML LUER SLIP (SYRINGE) IMPLANT
NEEDLE HYPO 22GX1.5 SAFETY (NEEDLE) ×3 IMPLANT
NEEDLE HYPO 25X5/8 SAFETYGLIDE (NEEDLE) ×3 IMPLANT
NS IRRIG 1000ML POUR BTL (IV SOLUTION) ×3 IMPLANT
PACK C SECTION WH (CUSTOM PROCEDURE TRAY) ×3 IMPLANT
PAD ABD 7.5X8 STRL (GAUZE/BANDAGES/DRESSINGS) ×3 IMPLANT
PAD OB MATERNITY 4.3X12.25 (PERSONAL CARE ITEMS) ×3 IMPLANT
PENCIL SMOKE EVAC W/HOLSTER (ELECTROSURGICAL) ×3 IMPLANT
RTRCTR C-SECT PINK 25CM LRG (MISCELLANEOUS) ×3 IMPLANT
SUT PDS AB 0 CTX 36 PDP370T (SUTURE) IMPLANT
SUT PLAIN 2 0 XLH (SUTURE) ×3 IMPLANT
SUT VIC AB 0 CTX 36 (SUTURE) ×4
SUT VIC AB 0 CTX36XBRD ANBCTRL (SUTURE) ×2 IMPLANT
SUT VIC AB 4-0 KS 27 (SUTURE) ×3 IMPLANT
SYR CONTROL 10ML LL (SYRINGE) ×3 IMPLANT
TOWEL OR 17X24 6PK STRL BLUE (TOWEL DISPOSABLE) ×3 IMPLANT
TRAY FOLEY BAG SILVER LF 14FR (SET/KITS/TRAYS/PACK) ×3 IMPLANT

## 2016-12-28 NOTE — Transfer of Care (Signed)
Immediate Anesthesia Transfer of Care Note  Patient: Carol [name]  Procedure(s) Performed: Procedure(s): CESAREAN SECTION WITH BILATERAL TUBAL LIGATION (Bilateral)  Patient Location: PACU  Anesthesia Type:Spinal  Level of Consciousness: awake  Airway & Oxygen Therapy: Patient Spontanous Breathing  Post-op Assessment: Report given to RN and Post -op Vital signs reviewed and stable  Post vital signs: stable  Last Vitals: There were no vitals filed for this visit.  Last Pain: There were no vitals filed for this visit.       Complications: No apparent anesthesia complications

## 2016-12-28 NOTE — Lactation Note (Signed)
This note was copied from a baby's chart. Lactation Consultation Note  Patient Name: Boy Willodean Angeles Berneda RoseMoran Today's Date: 12/28/2016 Reason for consult: Initial assessment Breastfeeding consultation services and support information given to patient.  Mom desires to both breast and formula feed.  She states she puts baby to breast first before offering formula.  Mom can hand express drop of colostrum.  Instructed to feed with any feeding cue and to call for assist prn.  Maternal Data Has patient been taught Hand Expression?: Yes Does the patient have breastfeeding experience prior to this delivery?: Yes  Feeding Feeding Type: Breast Fed Length of feed: 8 min  LATCH Score/Interventions Latch: Repeated attempts needed to sustain latch, nipple held in mouth throughout feeding, stimulation needed to elicit sucking reflex. Intervention(s): Adjust position;Assist with latch;Breast massage;Breast compression  Audible Swallowing: Spontaneous and intermittent  Type of Nipple: Everted at rest and after stimulation  Comfort (Breast/Nipple): Soft / non-tender     Hold (Positioning): Assistance needed to correctly position infant at breast and maintain latch.  LATCH Score: 8  Lactation Tools Discussed/Used     Consult Status Consult Status: Follow-up Date: 12/29/16 Follow-up type: In-patient    Huston FoleyMOULDEN, Blaise Palladino S 12/28/2016, 1:37 PM

## 2016-12-28 NOTE — Addendum Note (Signed)
Addendum  created 12/28/16 1250 by Yolonda Kidaarver, Saivion Goettel L, CRNA   Sign clinical note

## 2016-12-28 NOTE — Anesthesia Postprocedure Evaluation (Signed)
Anesthesia Post Note  Patient: [name]  Procedure(s) Performed: Procedure(s) (LRB): CESAREAN SECTION WITH BILATERAL TUBAL LIGATION (Bilateral)     Patient location during evaluation: Mother Baby Anesthesia Type: Spinal Level of consciousness: awake, awake and alert, oriented and patient cooperative Pain management: pain level controlled Vital Signs Assessment: post-procedure vital signs reviewed and stable Respiratory status: nonlabored ventilation, spontaneous breathing and respiratory function stable Cardiovascular status: stable Postop Assessment: no headache, no backache, patient able to bend at knees and no signs of nausea or vomiting Anesthetic complications: no Comments: Spanish interpreter Eda at bedside during interview.    Last Vitals:  Vitals:   12/28/16 1025 12/28/16 1125  BP: 118/70   Pulse: 81   Resp: 18 16  Temp: 36.7 C 36.7 C    Last Pain:  Vitals:   12/28/16 1125  TempSrc: Oral  PainSc: 2    Pain Goal: Patients Stated Pain Goal: 3 (12/28/16 0825)               Kathryne Ramella L

## 2016-12-28 NOTE — H&P (Signed)
Carol [name] is a 21 y.o. female G2P0101 at 426w6d presenting following SROM at 4:30am with onset of contractions. Patient had prenatal care at [facility] complicated by previous preterm delivery, GBS positive and desire for permanent sterilization. Patient currently uncomfortable with contractions  OB History    Gravida Para Term Preterm AB Living   2 1   1   1    SAB TAB Ectopic Multiple Live Births           1     Past Medical History:  Diagnosis Date  . 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 Positive GBS test 12/27/2016  . 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 NO PAST SURGERIES     Family History: family history includes Diabetes in her father and mother. Social History:  reports that she has never smoked. She has never used smokeless tobacco. She reports that she does not drink alcohol or use drugs.     Maternal Diabetes: No Genetic Screening: DSR- not determined Maternal Ultrasounds/Referrals: Normal Fetal Ultrasounds or other Referrals:  None Maternal Substance Abuse:  No Significant Maternal Medications:  None Significant Maternal Lab Results:  None Other Comments:  None  ROS History   There were no vitals taken for this visit. Exam Physical Exam  GENERAL: Well-developed, well-nourished female in no acute distress.  HEENT: Normocephalic, atraumatic. Sclerae anicteric.  NECK: Supple. Normal thyroid.  LUNGS: Clear to auscultation bilaterally.  HEART: Regular rate and rhythm. BREASTS: Symmetric in size. No palpable masses or lymphadenopathy, skin changes, or nipple drainage. ABDOMEN: Soft, nontender, gravid Cervical exam by CNM: complete, footling breech EXTREMITIES: No cyanosis, clubbing, or edema, 2+ distal pulses.  Prenatal labs: ABO, Rh: --/--/O POS (04/29 1958) Antibody: NEG (04/29 1958) Rubella: 6.44 (12/18  1034) RPR: Non Reactive (03/22 0807)  HBsAg: NEGATIVE (12/18 1034)  HIV: Non Reactive (03/22 0807)  GBS:   positive  Assessment/Plan: 21 yo G2P0101 at 416w6d with fetal malpresentation in labor - Discussed delivery via primary cesarean section. Risk, benefits and alternatives were explained including risks of bleeding, infection and damage to adjacent organs. Patient verbalized understanding and all questions were answered. Patient also desires permanent sterilization. Risks and benefits of procedure discussed with patient including permanence of method, bleeding, infection, injury to surrounding organs and need for additional procedures. Risk failure of 0.5-1% with increased risk of ectopic gestation if pregnancy occurs was also discussed with patient.   Jahir Halt 12/28/2016, 5:02 AM

## 2016-12-28 NOTE — Anesthesia Preprocedure Evaluation (Signed)
Anesthesia Evaluation  Patient identified by MRN, date of birth, ID band Patient awake    Reviewed: Allergy & Precautions, NPO status , Patient's Chart, lab work & pertinent test results  Airway Mallampati: III  TM Distance: >3 FB Neck ROM: Full    Dental no notable dental hx. (+) Teeth Intact   Pulmonary neg pulmonary ROS,    Pulmonary exam normal breath sounds clear to auscultation       Cardiovascular negative cardio ROS Normal cardiovascular exam Rhythm:Regular Rate:Normal     Neuro/Psych negative neurological ROS  negative psych ROS   GI/Hepatic negative GI ROS, Neg liver ROS,   Endo/Other  Obesity  Renal/GU Renal diseaseRight renal agenesis  negative genitourinary   Musculoskeletal negative musculoskeletal ROS (+)   Abdominal   Peds  Hematology negative hematology ROS (+)   Anesthesia Other Findings   Reproductive/Obstetrics (+) Pregnancy Breech presentation Fully dilated in labor Uterine didelphys                             Anesthesia Physical Anesthesia Plan  ASA: II and emergent  Anesthesia Plan: Spinal   Post-op Pain Management:    Induction:   Airway Management Planned: Natural Airway  Additional Equipment:   Intra-op Plan:   Post-operative Plan:   Informed Consent: I have reviewed the patients History and Physical, chart, labs and discussed the procedure including the risks, benefits and alternatives for the proposed anesthesia with the patient or authorized representative who has indicated his/her understanding and acceptance.     Plan Discussed with: Anesthesiologist, CRNA and Surgeon  Anesthesia Plan Comments:         Anesthesia Quick Evaluation

## 2016-12-28 NOTE — Op Note (Signed)
Carol [name] PROCEDURE DATE: 12/28/2016  PREOPERATIVE DIAGNOSIS: Intrauterine pregnancy at  8318w6d weeks gestation; malpresentation: frank breech in labor and desire for permanent sterilization  POSTOPERATIVE DIAGNOSIS: The same  PROCEDURE:     Cesarean Section and bilateral tubal ligation using Pomeroy method  SURGEON:  Dr. Catalina AntiguaPeggy Kenyata Napier  ASSISTANT: none  INDICATIONS: Carol [name] is a 21 y.o. G2P0101 at 4818w6d scheduled for cesarean section secondary to malpresentation: frank breech in labor and desire for permanent sterilization.  The risks of cesarean section discussed with the patient included but were not limited to: bleeding which may require transfusion or reoperation; infection which may require antibiotics; injury to bowel, bladder, ureters or other surrounding organs; injury to the fetus; need for additional procedures including hysterectomy in the event of a life-threatening hemorrhage; placental abnormalities wth subsequent pregnancies, incisional problems, thromboembolic phenomenon and other postoperative/anesthesia complications. The patient concurred with the proposed plan, giving informed written consent for the procedure.  Patient also desires permanent sterilization. Risks and benefits of procedure discussed with patient including permanence of method, bleeding, infection, injury to surrounding organs and need for additional procedures. Risk failure of 0.5-1% with increased risk of ectopic gestation if pregnancy occurs was also discussed with patient.   FINDINGS:  Viable female infant in frank breech presentation with nuchal cord x 2.  Apgars 8 and 9.  Clear amniotic fluid.  Intact placenta, three vessel cord.  Normal didelphic uterus with small left uterine horn, fallopian tubes and ovaries bilaterally.  ANESTHESIA:    Spinal INTRAVENOUS FLUIDS:2400 ml ESTIMATED BLOOD LOSS: 900 ml URINE OUTPUT:  150 ml SPECIMENS: Placenta sent to L&D COMPLICATIONS: None  immediate  PROCEDURE IN DETAIL:  The patient received intravenous antibiotics and had sequential compression devices applied to her lower extremities while in the preoperative area.  She was then taken to the operating room where anesthesia was induced and was found to be adequate. A foley catheter was placed into her bladder and attached to Carol [name] gravity. She was then placed in a dorsal supine position with a leftward tilt, and prepped and draped in a sterile manner. After an adequate timeout was performed, a Pfannenstiel skin incision was made with scalpel and carried through to the underlying layer of fascia. The fascia was incised in the midline and this incision was extended bilaterally using the Mayo scissors. Kocher clamps were applied to the superior aspect of the fascial incision and the underlying rectus muscles were dissected off bluntly. A similar process was carried out on the inferior aspect of the facial incision. The rectus muscles were separated in the midline bluntly and the peritoneum was entered bluntly. The Alexis self-retaining retractor was introduced into the abdominal cavity. Attention was turned to the lower uterine segment where a bladder flap was created, and a transverse hysterotomy was made with a scalpel and extended bilaterally bluntly. The infant was successfully delivered, and cord was clamped and cut and infant was handed over to awaiting neonatology team. Uterine massage was then administered and the placenta delivered intact with three-vessel cord. The uterus was cleared of clot and debris.  The hysterotomy was closed with 0 Vicryl in a running locked fashion, and an imbricating layer was also placed with a 0 Vicryl. Overall, excellent hemostasis was noted. The pelvis copiously irrigated and cleared of all clot and debris. Hemostasis was confirmed on all surfaces. The patient's left fallopian tube was then identified, brought to the incision, and grasped with a Babcock  clamp. The tube was then followed  out to the fimbria. The Babcock clamp was then used to grasp the tube approximately 4 cm from the cornual region. A 3 cm segment of the tube was then ligated with free tie of plain gut suture, transected and excised. Good hemostasis was noted and the tube was returned to the abdomen. The right fallopian tube was then identified to its fimbriated end, ligated, and a 3 cm segment excised in a similar fashion. Excellent hemostasis was noted, and the tube returned to the abdomen.  The peritoneum and the muscles were reapproximated using 0 vicryl interrupted stitches. The fascia was then closed using 0 Vicryl in a running fashion.  The subcutaneous layer was reapproximated with plain gut and the skin was closed in a subcuticular fashion using 3.0 Vicryl. The patient tolerated the procedure well. Sponge, lap, instrument and needle counts were correct x 2. She was taken to the recovery room in stable condition.    Carol Kellison ConstantMD  12/28/2016 6:20 AM

## 2016-12-28 NOTE — Anesthesia Procedure Notes (Signed)
Spinal  Patient location during procedure: OR Start time: 12/28/2016 5:19 AM Staffing Anesthesiologist: Mal AmabileFOSTER, Masie Bermingham Performed: anesthesiologist  Preanesthetic Checklist Completed: patient identified, site marked, surgical consent, pre-op evaluation, timeout performed, IV checked, risks and benefits discussed and monitors and equipment checked Spinal Block Patient position: sitting Prep: site prepped and draped and DuraPrep Patient monitoring: heart rate, cardiac monitor, continuous pulse ox and blood pressure Approach: midline Location: L3-4 Injection technique: single-shot Needle Needle type: Pencan  Needle gauge: 24 G Needle length: 9 cm Needle insertion depth: 5 cm Assessment Sensory level: T4 Additional Notes Patient tolerated procedure well. Adequate sensory level.

## 2016-12-28 NOTE — Anesthesia Postprocedure Evaluation (Signed)
Anesthesia Post Note  Patient: Carol [name]  Procedure(s) Performed: Procedure(s) (LRB): CESAREAN SECTION WITH BILATERAL TUBAL LIGATION (Bilateral)     Patient location during evaluation: PACU Anesthesia Type: Spinal Level of consciousness: awake and alert and oriented Pain management: pain level controlled Vital Signs Assessment: post-procedure vital signs reviewed and stable Respiratory status: spontaneous breathing, nonlabored ventilation and respiratory function stable Cardiovascular status: blood pressure returned to baseline and stable Postop Assessment: no headache, no backache, spinal receding, patient able to bend at knees and no signs of nausea or vomiting Anesthetic complications: no    Last Vitals:  Vitals:   12/28/16 0635 12/28/16 0645  BP: 114/71 (!) 112/95  Pulse: (!) 122 (!) 117  Resp: 20 (!) 27  Temp: 36.7 C     Last Pain:  Vitals:   12/28/16 0645  TempSrc:   PainSc: 0-No pain   Pain Goal:                 Craig Ionescu A.

## 2016-12-29 ENCOUNTER — Encounter (HOSPITAL_COMMUNITY): Payer: Self-pay | Admitting: Obstetrics and Gynecology

## 2016-12-29 LAB — CBC
HCT: 24.4 % — ABNORMAL LOW (ref 36.0–46.0)
Hemoglobin: 8.3 g/dL — ABNORMAL LOW (ref 12.0–15.0)
MCH: 31.1 pg (ref 26.0–34.0)
MCHC: 34 g/dL (ref 30.0–36.0)
MCV: 91.4 fL (ref 78.0–100.0)
PLATELETS: 186 10*3/uL (ref 150–400)
RBC: 2.67 MIL/uL — AB (ref 3.87–5.11)
RDW: 13.9 % (ref 11.5–15.5)
WBC: 11.3 10*3/uL — AB (ref 4.0–10.5)

## 2016-12-29 LAB — BIRTH TISSUE RECOVERY COLLECTION (PLACENTA DONATION)

## 2016-12-29 NOTE — Progress Notes (Signed)
Patient instructed we have interpretors available 24hrs a day.  explained if she needed one or if she did not understand what we were asking or saying to please ask for an interpretor.  She said that she understood and did not want and interpretor.  Patient was able to repeat and explain back to me ewhat she was saying

## 2016-12-29 NOTE — Progress Notes (Signed)
Eda in-house interpreter used to explain care to patient and answer questions pt may have.  This RN and MD repeatedly instructed pt to remove large fluffy blue blanket from baby's crib; mom did not remove blanket. Upon assessing baby, this RN removed large fluffy blue blanket and another hospital blanket from baby's crib; and again educated parents on safe sleep habits.

## 2016-12-29 NOTE — Progress Notes (Signed)
POSTPARTUM PROGRESS NOTE  Post Operative Day 1 Subjective:  Carol [name] is a 21 y.o. [id number] 5165w6d s/p PLTCS for breech.  No acute events overnight.  Pt denies problems with ambulating, voiding or po intake.  She denies nausea or vomiting.  Pain is moderately controlled.  She has had flatus. She has not had bowel movement.  Lochia Small.   Objective: Blood pressure 111/71, pulse 95, temperature 98.4 F (36.9 C), temperature source Oral, resp. rate 18, SpO2 100 %, unknown if currently breastfeeding.  Physical Exam:  General: alert, cooperative and no distress Lochia:normal flow Chest: no respiratory distress Heart:regular rate, distal pulses intact Incision:  20% saturated at the midline. Abdomen: soft, nontender,  Uterine Fundus: firm, appropriately tender DVT Evaluation: No calf swelling or tenderness Extremities: trace edema   Recent Labs  12/28/16 0504 12/29/16 0511  HGB 12.3 8.3*  HCT 35.5* 24.4*    Assessment/Plan:  ASSESSMENT: Carol [name] is a 21 y.o. [id number] 7365w6d s/p PLTCS for breech  Plan for discharge tomorrow and Breastfeeding   LOS: 1 day   Les Pouicholas SchenkMD 12/29/2016, 7:36 AM

## 2016-12-30 MED ORDER — IBUPROFEN 600 MG PO TABS: 600.0000 mg | ORAL_TABLET | Freq: Four times a day (QID) | ORAL | 0 refills | Status: AC

## 2016-12-30 MED ORDER — SENNOSIDES-DOCUSATE SODIUM 8.6-50 MG PO TABS
1.0000 | ORAL_TABLET | Freq: Every evening | ORAL | 0 refills | Status: AC | PRN
Start: 2016-12-30 — End: ?

## 2016-12-30 MED ORDER — FERROUS SULFATE 325 (65 FE) MG PO TABS: 325.0000 mg | ORAL_TABLET | Freq: Every day | ORAL | 3 refills | Status: AC

## 2016-12-30 MED ORDER — OXYCODONE HCL 5 MG PO TABS: 5.0000 mg | ORAL_TABLET | Freq: Four times a day (QID) | ORAL | 0 refills | Status: AC | PRN

## 2016-12-30 MED ORDER — PRENATAL MULTIVITAMIN CH: 1.0000 | ORAL_TABLET | Freq: Every day | ORAL | 0 refills | Status: AC

## 2016-12-30 NOTE — Discharge Summary (Signed)
OB Discharge Summary     Patient Name: Carol [name] DOB: 1996-07-12 MRN: [id number]  Date of admission: 12/28/2016 Delivering MD: Catalina Antigua   Date of discharge: 12/30/2016  Admitting diagnosis: 40 WEEKS CTX Primary CS Breech Undesired Fertility Intrauterine pregnancy: [redacted]w[redacted]d     Secondary diagnosis:  Active Problems:   S/P cesarean section  Additional problems:  Patient Active Problem List   Diagnosis Date Noted  . S/P cesarean section 12/28/2016  . GBS bacteriuria 12/27/2016  . Nephrolithiasis 11/24/2016  . Pyelonephritis affecting pregnancy 11/22/2016  . Proteinuria affecting pregnancy in third trimester 10/31/2016  . 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 Supervision of high-risk pregnancy 07/13/2016  . History of preterm delivery, currently pregnant 07/13/2016  . 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 Language barrier 03/02/2014       Discharge diagnosis: Term Pregnancy Delivered                                         Hospital course:  Onset of Labor With Unplanned C/S  21 y.o. yo [id number] at [redacted]w[redacted]d was admitted in Latent Labor on 12/28/2016. Membrane Rupture Time/Date: 4:00 AM ,12/28/2016   The patient went for cesarean section due to Malpresentation, and delivered a Viable infant,12/28/2016  Details of operation can be found in separate operative note. Patient had an uncomplicated postpartum course.  She is ambulating,tolerating a regular diet, passing flatus, and urinating well.  Patient is discharged home in stable condition 12/30/16.  Physical exam  Vitals:   12/29/16 0551 12/29/16 1740 12/30/16 0100 12/30/16 0605  BP: 111/71 112/71  120/70  Pulse: 95 (!) 105  (!) 108  Resp: 18 20  18   Temp: 98.4 F (36.9 C) 98.5 F (36.9 C)  97.6 F (36.4 C)  TempSrc: Oral Oral  Oral  SpO2:      Weight:   87.5 kg (193 lb)   Height:   5\' 4"  (1.626 m)    General: alert, cooperative and no  distress Lochia: appropriate Uterine Fundus: firm Incision: Healing well with no significant drainage DVT Evaluation: No evidence of DVT seen on physical exam. Labs: Lab Results  Component Value Date   WBC 11.3 (H) 12/29/2016   HGB 8.3 (L) 12/29/2016   HCT 24.4 (L) 12/29/2016   MCV 91.4 12/29/2016   PLT 186 12/29/2016   CMP Latest Ref Rng & Units 12/25/2016  Glucose 65 - 99 mg/dL 90  BUN 6 - 20 mg/dL 7  Creatinine 1.47 - 8.29 mg/dL 5.62  Sodium 130 - 865 mmol/L 136  Potassium 3.5 - 5.1 mmol/L 4.8  Chloride 101 - 111 mmol/L 108  CO2 22 - 32 mmol/L 21(L)  Calcium 8.9 - 10.3 mg/dL 9.3  Total Protein 6.5 - 8.1 g/dL 6.9  Total Bilirubin 0.3 - 1.2 mg/dL 0.3  Alkaline Phos 38 - 126 U/L 122  AST 15 - 41 U/L 18  ALT 14 - 54 U/L 15    Discharge instruction: per After Visit Summary and "Baby and Me Booklet".  After visit meds:  Allergies as of 12/30/2016   No Known Allergies     Medication List    STOP taking these medications   amoxicillin 500 MG capsule Commonly known as:  AMOXIL   Prenatal Vitamins 0.8 MG tablet     TAKE these medications   ferrous sulfate 325 (  65 FE) MG tablet Take 1 tablet (325 mg total) by mouth daily.   ibuprofen 600 MG tablet Commonly known as:  ADVIL,MOTRIN Take 1 tablet (600 mg total) by mouth every 6 (six) hours.   oxyCODONE 5 MG immediate release tablet Commonly known as:  Oxy IR/ROXICODONE Take 1 tablet (5 mg total) by mouth every 6 (six) hours as needed for severe pain or breakthrough pain (pain scale 4-7).   prenatal multivitamin Tabs tablet Take 1 tablet by mouth daily at 12 noon.   senna-docusate 8.6-50 MG tablet Commonly known as:  Senokot-S Take 1 tablet by mouth at bedtime as needed for mild constipation.       Diet: routine diet  Activity: Advance as tolerated. Pelvic rest for 6 weeks.   Outpatient follow up:6 weeks Follow up Appt:Future Appointments Date Time Provider Department Center  02/08/2017 2:40 PM Marny LowensteinWenzel, Julie  N, PA-C [facility]   Follow up Visit:No Follow-up on file.  Postpartum contraception: BTL  Newborn Data: Live born female  Birth Weight: 6 lb 7.4 oz (2930 g) APGAR: 8, 9  Baby Feeding: Breast Disposition:home with mother   12/30/2016 Howard PouchLauren Feng, MD   I spoke with and examined patient and agree with resident/PA/SNM's note and plan of care. POD#2 C/S for breech, and BTL. Hgb 8.3 from starting 12.3. Not currently on Fe supplements. Eating, drinking, voiding, ambulating well.  +flatus.  Lochia and pain wnl.  Denies dizziness, lightheadedness, or sob. No complaints. Wants to go home today. Breastfeeding, declines circumcision. Incision w/o s/s infection or excessive drainage.  D/C today as long as peds OK w/ baby going home. Start on Fe BID, increase fe-rich foods.  F/U 4-6wks for pp visit.   Cheral MarkerKimberly R. Masaye Gatchalian, CNM, [facility]WHNP-BC 12/31/2016 9:51 AM

## 2016-12-30 NOTE — Progress Notes (Signed)
Breakfast ordered for patient  

## 2016-12-30 NOTE — Discharge Instructions (Signed)
Parto por cesárea, cuidados posteriores °(Cesarean Delivery, Care After) °Siga estas instrucciones durante las próximas semanas. Estas indicaciones le proporcionan información acerca de cómo deberá cuidarse después del procedimiento. El médico también podrá darle instrucciones más específicas. El tratamiento ha sido planificado según las prácticas médicas actuales, pero en algunos casos pueden ocurrir problemas. Comuníquese con el médico si tiene algún problema o dudas después del procedimiento. °QUÉ ESPERAR DESPUÉS DEL PROCEDIMIENTO °Después del procedimiento, es común tener los siguientes síntomas: °· Una pequeña cantidad de sangre o un líquido transparente que sale de la incisión. °· Tiene enrojecimiento, hinchazón o dolor en la zona de la incisión. °· Dolores y molestias abdominales. °· Hemorragia vaginal (loquios). °· Calambres pélvicos. °· Fatiga. °INSTRUCCIONES PARA EL CUIDADO EN EL HOGAR °Cuidado de la incisión °· Siga las indicaciones del médico acerca del cuidado de la incisión. Haga lo siguiente: °? Lávese las manos con agua y jabón antes de cambiar las vendas (vendaje). Use desinfectante para manos si no dispone de agua y jabón. °? Cambie el vendaje como se lo haya indicado el médico. °? No retire los puntos (suturas), las grapas cutáneas, el pegamento para la piel o las tiras adhesivas. Es posible que estos deban quedar puestos en la piel durante 2 semanas o más tiempo. Si los bordes de las tiras adhesivas empiezan a despegarse y enroscarse, puede recortar los que están sueltos. No retire las tiras adhesivas por completo a menos que el médico se lo indique. °· Controle todos los días la zona de la incisión para detectar signos de infección. Esté atenta a los siguientes signos: °? Aumento del enrojecimiento, la hinchazón o el dolor. °? Más líquido o sangre. °? Calor. °? Pus o mal olor. °· Cuando tosa o estornude, abrace una almohada. Esto ayuda con el dolor y disminuye la posibilidad de que su incisión  se abra (dehiscencia). Haga esto hasta que cicatrice completamente. °Medicamentos °· Tome los medicamentos de venta libre y los recetados solamente como se lo haya indicado el médico. °· Si le recetaron un antibiótico, tómelo como se lo haya indicado el médico. No interrumpa la administración del antibiótico hasta que no lo hay terminado. °Conducir °· No conduzca ni opere maquinaria pesada mientras toma analgésicos recetados. °· No conduzca durante 24 horas si le administraron un sedante. °Estilo de vida °· No beba alcohol. Esto es de suma importancia si está amamantando o toma analgésicos. °· No consuma productos que contengan tabaco, incluidos cigarrillos, tabaco de mascar o cigarrillos electrónicos. Si necesita ayuda para dejar de fumar, consulte al médico. El tabaco puede retrasar la cicatrización. °Comida y bebida °· Beba al menos 8 vasos de 8 onzas (240 cc) de agua todos los días a menos que el médico le indique lo contrario. Si amamanta, quizá deba beber aún más cantidad de agua. °· Ingiera alimentos ricos en fibras todos los días. Estos alimentos pueden ayudarla a prevenir o aliviar el estreñimiento. Los alimentos ricos en fibras incluyen, entre otros: °? Panes y cereales integrales. °? Arroz integral. °? Frijoles. °? Frutas y verduras frescas. °Actividad °· Reanude sus actividades normales como se lo haya indicado el médico. Pregúntele al médico qué actividades son seguras para usted. °· Descanse todo lo que pueda. Trate de descansar o tomar una siesta mientras el bebé duerme. °· No levante objetos que pesen más que su bebé o más de 10 libras (4,5 kg), como se lo haya indicado el médico. °· Pregúntele al médico cuándo puede volver a tener relaciones sexuales. Esto puede depender de lo siguiente: °? Riesgo de sufrir   infecciones. °? Velocidad de cicatrización. °? Comodidad y deseo de tener relaciones sexuales. °El baño °· No tome baños de inmersión, no nade ni use el jacuzzi hasta que el médico lo autorice.  Pregúntele al médico si puede ducharse. Tal vez solo le permitan darse baños de esponja hasta que la incisión se cure. °· Mantenga el vendaje seco, como se lo haya indicado el médico. °Instrucciones generales °· No use tampones ni se haga duchas vaginales hasta que el médico la autorice. °· Use lo siguiente: °? Ropa cómoda y suelta. °? Un sostén firme y bien ajustado. °· Controle la sangre que elimina por la vagina para detectar coágulos. Pueden tener el aspecto de grumos de color rojo oscuro, marrón o negro. °· Mantenga el perineo limpio y seco, como se lo haya indicado el médico. °· Cuando vaya al baño, siempre higienícese de adelante hacia atrás. °· Si es posible, consiga que alguien la ayude a cuidar de su bebé y con las tareas domésticas durante algunos días después de recibir el alta hospitalaria °· Concurra a todas las visitas de seguimiento para usted y el bebé, como se lo haya indicado el médico. Esto es importante. °SOLICITE ATENCIÓN MÉDICA SI: °· Tiene los siguientes síntomas: °? Una secreción vaginal con mal olor. °? Dificultad para orinar. °? Dolor al orinar. °? Aumento o disminución repentinos de la frecuencia con que defeca. °? Aumento del enrojecimiento, de la hinchazón o del dolor alrededor de la incisión. °? Aumento del líquido o de la sangre que sale de la incisión. °? Pus o mal olor en el lugar de la incisión. °? Fiebre. °? Una erupción cutánea. °? Poco interés o falta de interés en actividades que solían gustarle. °? Dudas sobre su cuidado y el del bebé. °? Náuseas. °· La incisión está caliente al tacto. °· Sus senos se ponen rojos o duros, o causan dolor. °· Siente tristeza o preocupación de forma inusual. °· Vomita. °· Elimina coágulos grandes por la vagina. Si elimina un coágulo de sangre, guárdelo para mostrárselo al médico. No elimine los coágulos sanguíneos por el inodoro sin mostrárselos a su médico. °· Orina más de lo habitual. °· Se siente mareada o se desmaya. °· No amamantó al bebé y  no tuvo su período menstrual en un período de 12 semanas posterior al parto. °· Usted dejó de amamantar al bebé y no tuvo su período menstrual en un período de 12 semanas posterior a haber dejado de amamantar. ° °SOLICITE ATENCIÓN MÉDICA DE INMEDIATO SI: °· Tiene los siguientes síntomas: °? Dolor que no desaparece o no mejora con el medicamento. °? Dolor en el pecho. °? Dificultad para respirar. °? Visión borrosa o manchas en la vista. °? Pensamientos de autolesionarse o lesionar al bebé. °? Nuevo dolor en el abdomen o en una de las piernas. °? Dolor de cabeza intenso. °· Se desmaya. °· Tiene una hemorragia tan intensa de la vagina que empapa dos toallitas sanitarias en una hora. ° °Esta información no tiene como fin reemplazar el consejo del médico. Asegúrese de hacerle al médico cualquier pregunta que tenga. °Document Released: 07/13/2005 Document Revised: 11/04/2015 Document Reviewed: 06/17/2015 °Elsevier Interactive Patient Education © 2017 [employer] ° °

## 2016-12-30 NOTE — Plan of Care (Signed)
Problem: Nutritional: Goal: Mothers verbalization of comfort with breastfeeding process will improve Outcome: Completed/Met Date Met: 12/30/16 Mom has been feeding more and pumping.  Baby seems satisfied.  Mom still concern with milk products.  Reassured patient about feeding and pumping   Problem: Bowel/Gastric: Goal: Gastrointestinal status will improve Outcome: Progressing Passing gas

## 2016-12-30 NOTE — Progress Notes (Signed)
Patient given Kpad to use for back pain.  Educated on use via [employer]

## 2016-12-30 NOTE — Lactation Note (Signed)
This note was copied from a baby's chart. Lactation Consultation Note  Patient Name: Boy Chenae Brager QBHAL'P Date: 12/30/2016 Reason for consult: Follow-up assessment (Dr. Delmar Landau - into see mom and baby and translated Spanish for breast feed ing -see LC note)  Baby is 40 hours old and has been to the breast and supplemented.  Mom desires to breast feed. And is aware due to her hx of low milk supply for now until the milk comes in to supplement.  Mom  mention when asked her milk did come in with her 1st baby and had engorgement issues, her baby was in NICU and  Never would latch well.  Per mom did have breast changes with both pregnancies, in size, areola got darker, and breast sensitive.  LC reviewed steps for latching, and the importance of feeding STS every feeding until the baby can stay awake for a feeding.  LC recommended due the gestational age and the recommended post pumping, to feed 1st breast 20 mins , supplement and post pump and the next feeding switch the baby to the other breast.  Sore nipple and engorgement prevention and tx reviewed. Mom has the DEBP kit ( Medela ) to go home and has a WIC  Appt. In [place] tomorrow. LC will fax a DEBP request for form to make them aware she will need a pump .  LC stressed the baby can latch well , give the baby the opportunity to get to use to latching.  @ the consult baby awake, hungry to start, and latched well with depth, fed for 10 mins and got sluggish so LC had mom  Release latch.  Mother informed of post-discharge support and given phone number to the lactation department, including services for phone call assistance; out-patient appointments; and breastfeeding support group. List of other breastfeeding resources in the community given in the handout. Encouraged mother to call for problems or concerns related to breastfeeding.   Maternal Data Has patient been taught Hand Expression?: Yes  Feeding Feeding Type:  (baby  still latched with depth , lots of non- nutritive pattern noted ) Nipple Type: Slow - flow Length of feed: 10 min (few swallows )  LATCH Score/Interventions Latch: Grasps breast easily, tongue down, lips flanged, rhythmical sucking. Intervention(s): Adjust position;Assist with latch;Breast massage;Breast compression  Audible Swallowing: A few with stimulation Intervention(s): Skin to skin;Hand expression Intervention(s): Skin to skin;Hand expression  Type of Nipple: Everted at rest and after stimulation  Comfort (Breast/Nipple): Soft / non-tender  Problem noted: Mild/Moderate discomfort Interventions (Mild/moderate discomfort): Hand massage;Hand expression  Hold (Positioning): Assistance needed to correctly position infant at breast and maintain latch. Intervention(s): Breastfeeding basics reviewed  LATCH Score: 8  Lactation Tools Discussed/Used Tools: Pump Breast pump type: Double-Electric Breast Pump WIC Program: Yes (per mom [place] , and aPPT TOMORROW - WILL fAX A FORM FOR PUMP REQUEST , MOM AWARE)   Consult Status Consult Status: Complete Date: 12/30/16 Follow-up type: In-patient    [place] 12/30/2016, 12:37 PM

## 2016-12-30 NOTE — Progress Notes (Signed)
Patient reports back better with k pad

## 2016-12-31 ENCOUNTER — Encounter: Payer: Medicaid Other | Admitting: Obstetrics & Gynecology

## 2017-01-01 NOTE — MAU Note (Signed)
[id number]: Patient presents to MAU with c/o ROM and labor. Husband states "her water broke at 0400, has been having contractions all weekend and needs help to get her out of car".   [id number]: Bypass triage and admissions desk with patient appearing uncomfortable in wheelchair, elevated on her right hip.  0450: Cervical exam: Patient complete and footling breech. Called out to staff for notification to the attending, anesthesia, and OR.   0455: Dr Jolayne Pantherconstant notified   0501: Dr Constant @ bedside. Prep patient for OR   0513: Patient transferred to OR for urgent C/S

## 2017-01-04 ENCOUNTER — Encounter (HOSPITAL_COMMUNITY): Admission: RE | Admit: 2017-01-04 | Payer: Medicaid Other | Source: Ambulatory Visit

## 2017-01-05 ENCOUNTER — Inpatient Hospital Stay (HOSPITAL_COMMUNITY)
Admission: RE | Admit: 2017-01-05 | Payer: Medicaid Other | Source: Ambulatory Visit | Admitting: Obstetrics & Gynecology

## 2017-01-07 ENCOUNTER — Encounter: Payer: Medicaid Other | Admitting: Obstetrics and Gynecology

## 2017-01-11 ENCOUNTER — Encounter: Payer: Medicaid Other | Admitting: Obstetrics and Gynecology

## 2017-02-08 ENCOUNTER — Encounter: Payer: Self-pay | Admitting: Medical

## 2017-02-08 ENCOUNTER — Ambulatory Visit (INDEPENDENT_AMBULATORY_CARE_PROVIDER_SITE_OTHER): Payer: Medicaid Other | Admitting: Medical

## 2017-02-08 NOTE — Progress Notes (Signed)
Subjective:     Carol [name] is a 21 y.o. female who presents for a postpartum visit. She is 6 weeks postpartum following a low cervical transverse Cesarean section. I have fully reviewed the prenatal and intrapartum course. The delivery was at 37 gestational weeks. Outcome: primary cesarean section, low transverse incision. Anesthesia: spinal. Postpartum course has been normal. Baby's course has been normal. Baby is feeding by both breast and bottle - Similac Advance. Bleeding no bleeding. Bowel function is abnormal: occasinal constipation, relieved with Colace. Bladder function is normal. Patient is not sexually active. Contraception method is tubal ligation. Postpartum depression screening: negative.  The following portions of the patient's history were reviewed and updated as appropriate: allergies, current medications, past family history, past medical history, past social history, past surgical history and problem list.  Review of Systems Pertinent items are noted in HPI.    Objective:    BP 121/81   Pulse 73   Ht 4\' 9"  (1.448 m)   Wt 175 lb 4.8 oz (79.5 kg)   LMP  (LMP Unknown)   Breastfeeding? Yes   BMI 37.93 kg/m   General:  alert and cooperative   Breasts:  not evaluated  Lungs: clear to auscultation bilaterally  Heart:  regular rate and rhythm, S1, S2 normal, no murmur, click, rub or gallop  Abdomen: soft, non-tender; bowel sounds normal; no masses,  no organomegaly incision well-healed   Vulva:  not evaluated  Vagina: not evaluated  Cervix:  not evaluated  Corpus: not examined  Adnexa:  not evaluated  Rectal Exam: Not performed.        Assessment:     Normal postpartum exam. Pap smear not done at today's visit.  Had normal pap smear in [place] 10/2015 per patient  Plan:    1. Contraception: tubal ligation 2. Follow up in: 1 year for annual exam or as needed.    Marny LowensteinWenzel, Julie N, PA-C 02/08/2017 3:20 PM

## 2017-11-30 ENCOUNTER — Encounter: Payer: Self-pay | Admitting: *Deleted

## 2018-09-28 ENCOUNTER — Telehealth: Payer: Self-pay | Admitting: *Deleted

## 2018-09-28 NOTE — Telephone Encounter (Signed)
Carol [name] called back and left another message she would like medical information sent to her email. I called Carol [name] back with [employer] [phone] and left message am returning her call and we cannot send information to her email - she must come to office.  May call us if needed.

## 2018-09-28 NOTE — Telephone Encounter (Signed)
Carol [name] left a message last night  That she would like medical information sent to her email.

## 2018-09-28 NOTE — Telephone Encounter (Signed)
I called Dawn with [employer] [phone] and left a message I am returning your call; you will need to come by the office to get the information you requested.  Please call if questions.

## 2018-10-15 IMAGING — US US RENAL
1 series · 15 of 25 positions shown · non-contrast
Comparison: 10/19/2016

CLINICAL DATA: Left flank pain with oliguria and hematuria

EXAM:
RENAL / URINARY TRACT ULTRASOUND COMPLETE

[Series 1: us renal · 15 of 29 slices shown]
[im 1/29]
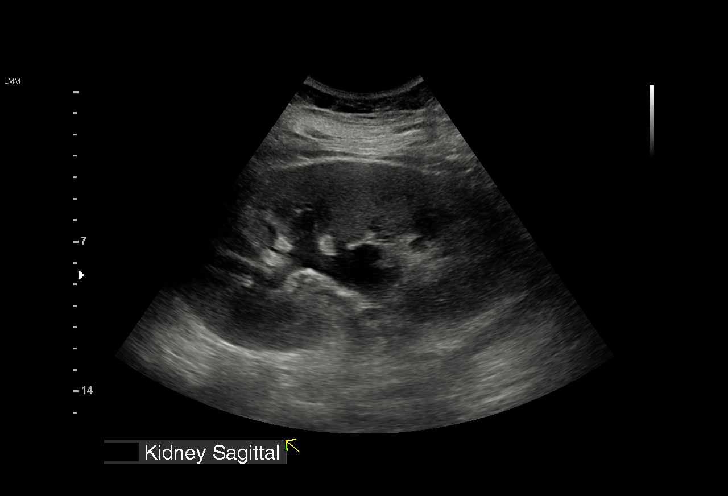
[im 3/29]
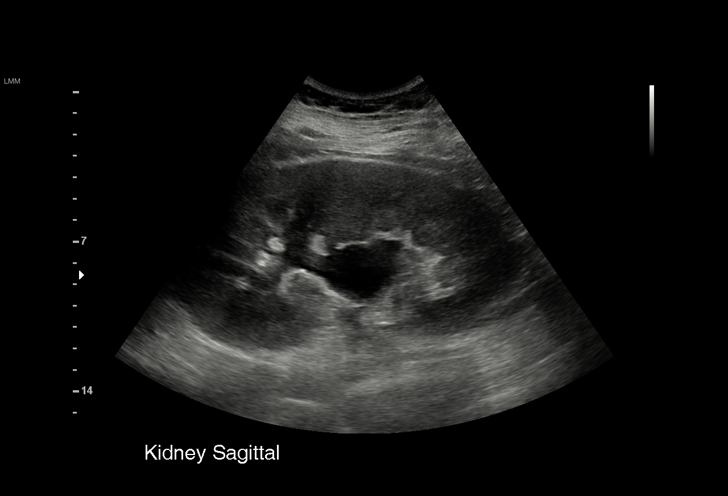
[im 5/29]
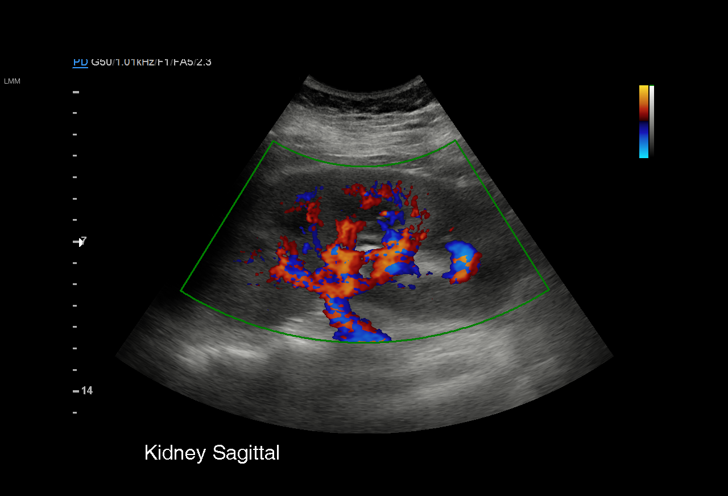
[im 6/29]
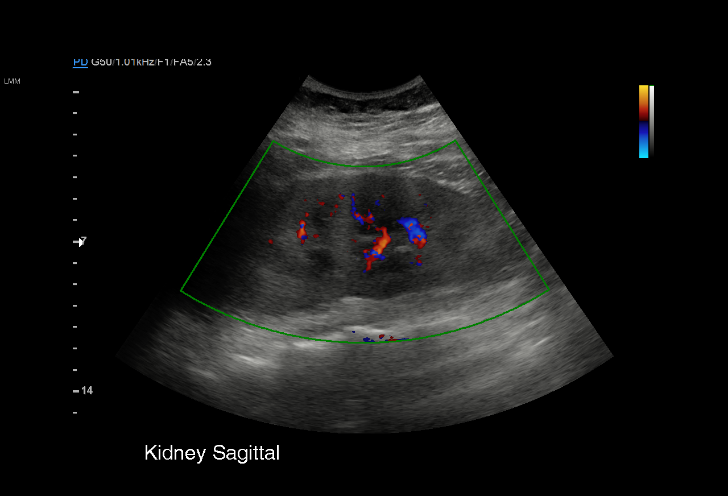
[im 9/29]
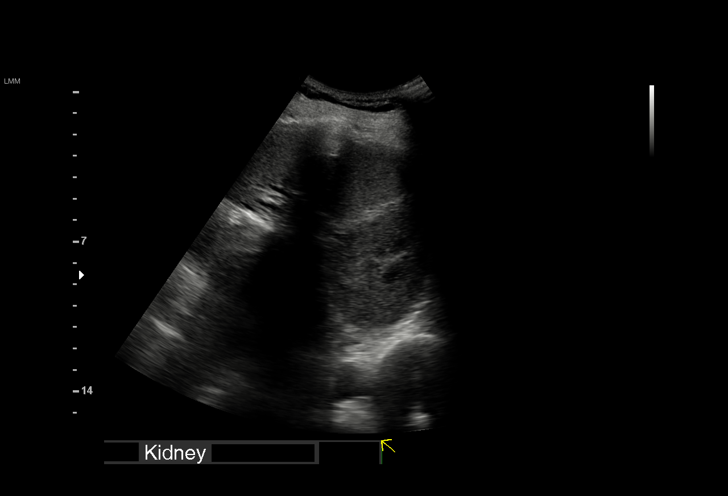
[im 11/29]
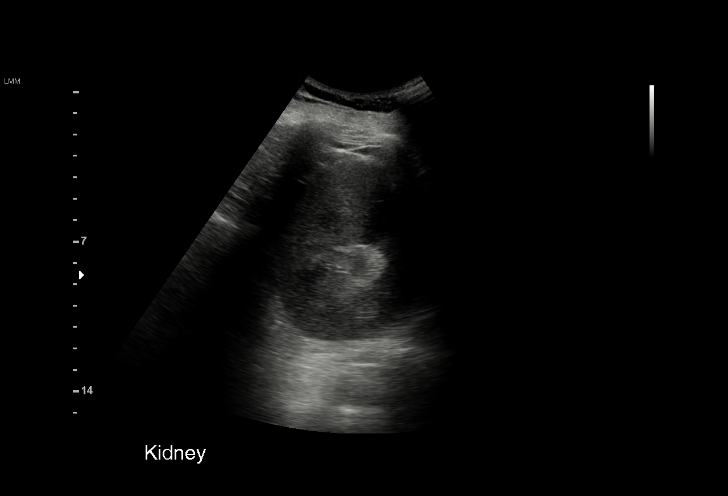
[im 12/29]
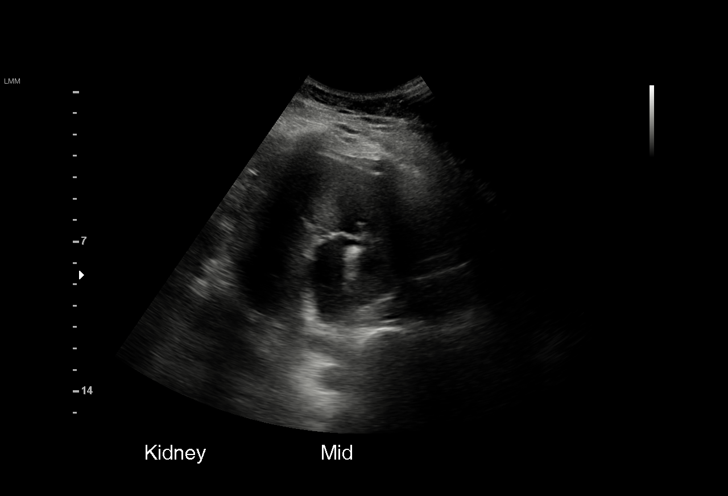
[im 15/29]
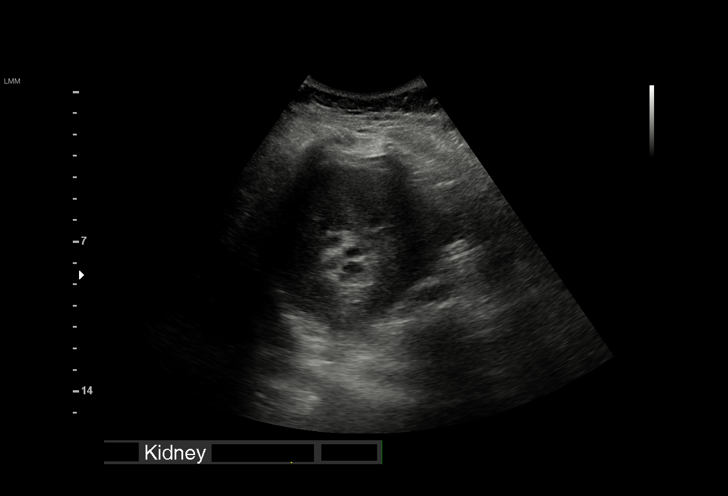
[im 17/29]
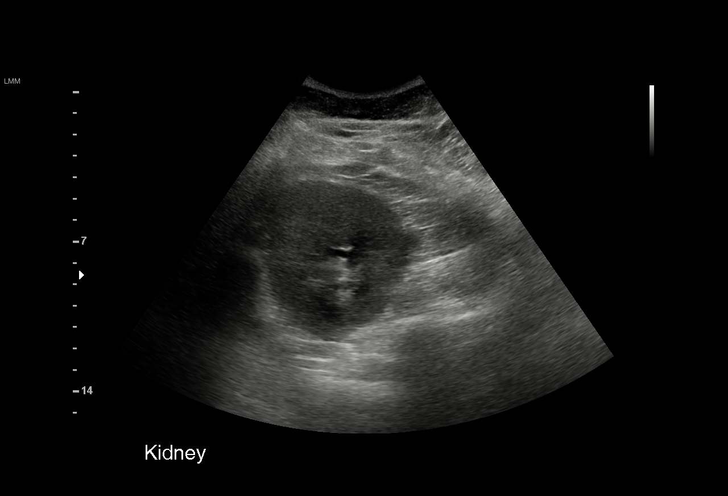
[im 18/29]
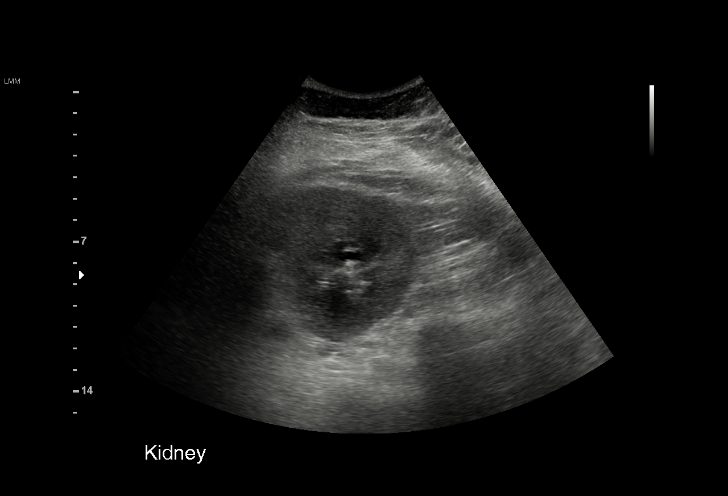
[im 20/29]
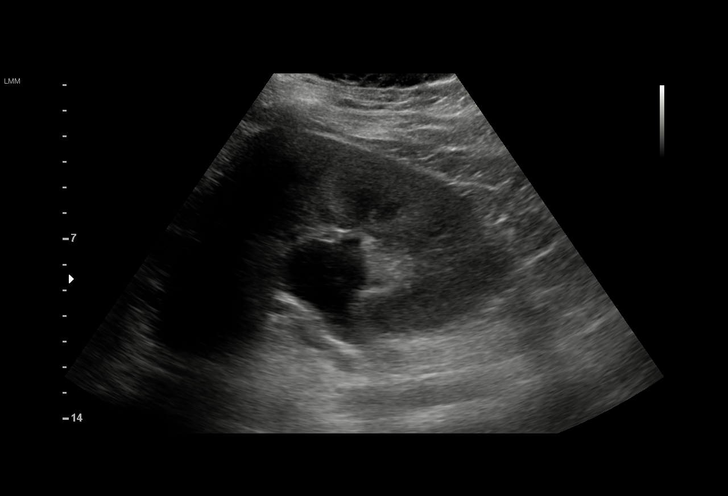
[im 23/29]
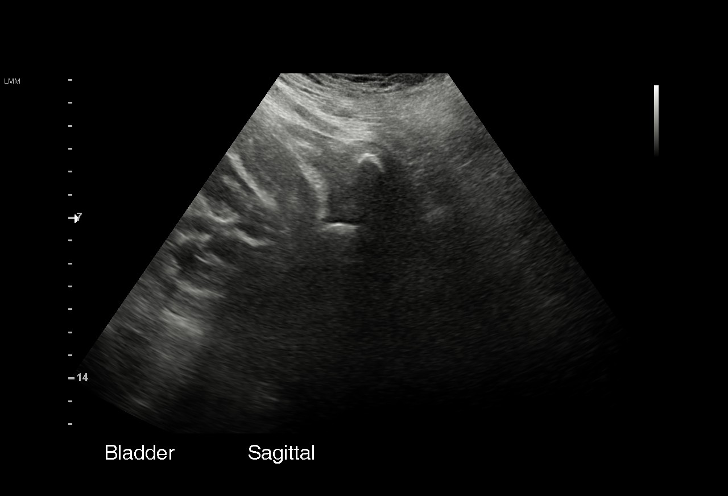
[im 24/29]
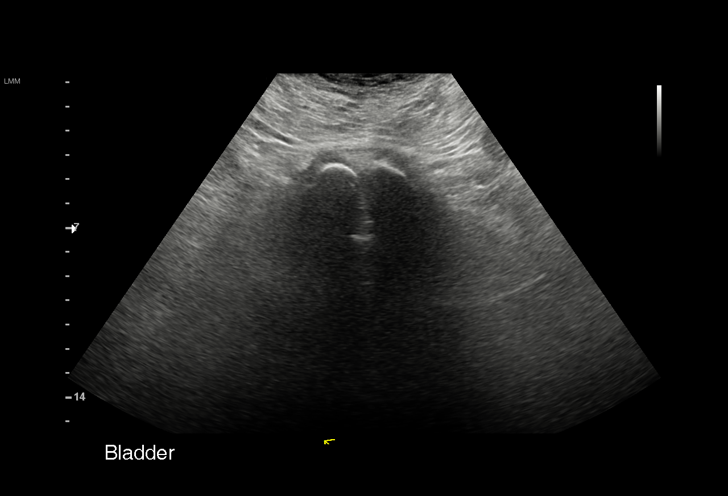
[im 26/29]
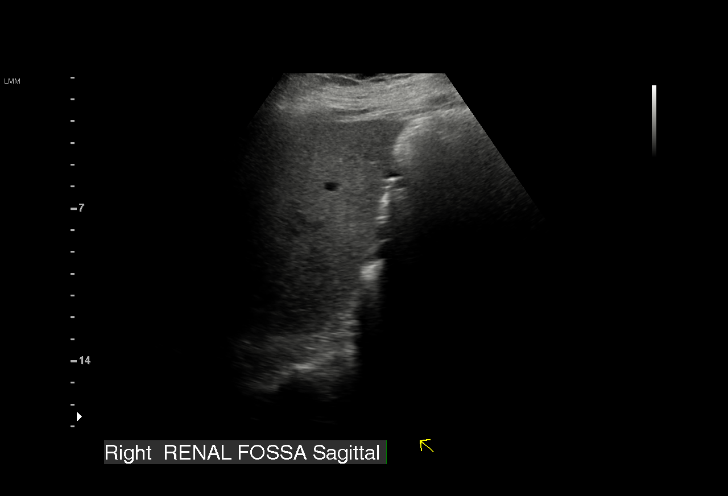
[im 29/29]
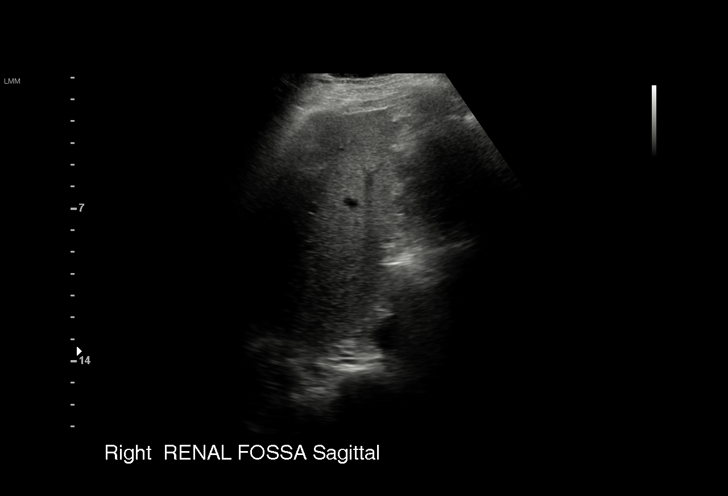

[15 of 25 positions shown; findings below may reference images not displayed]

FINDINGS: Right Kidney:

Nonvisualized.

Left Kidney:

Length: 16.2 cm. Echogenicity within normal limits. No mass is
visualized. There is mild hydronephrosis that appears similar
compared to the previous exam. Urinary bladder is empty and may
contain a Foley catheter.

Bladder:

Appears normal for degree of bladder distention.
IMPRESSION: 1. Nonvisualization of the right kidney presumably related to the
history of renal agenesis
2. Mild left hydronephrosis does not appear significantly changed
compared to the prior ultrasound.
